# Patient Record
Sex: Female | Born: 1955 | Race: White | Hispanic: No | Marital: Married | Smoking: Former smoker
Health system: Southern US, Community
[De-identification: ages and names within clinical notes are randomized; demographics above are authoritative.]

## PROBLEM LIST (undated history)

## (undated) DIAGNOSIS — M1990 Unspecified osteoarthritis, unspecified site: Secondary | ICD-10-CM

## (undated) DIAGNOSIS — C4492 Squamous cell carcinoma of skin, unspecified: Secondary | ICD-10-CM

## (undated) DIAGNOSIS — M810 Age-related osteoporosis without current pathological fracture: Secondary | ICD-10-CM

## (undated) DIAGNOSIS — J302 Other seasonal allergic rhinitis: Secondary | ICD-10-CM

## (undated) DIAGNOSIS — C4491 Basal cell carcinoma of skin, unspecified: Secondary | ICD-10-CM

## (undated) DIAGNOSIS — E041 Nontoxic single thyroid nodule: Secondary | ICD-10-CM

## (undated) DIAGNOSIS — G43009 Migraine without aura, not intractable, without status migrainosus: Secondary | ICD-10-CM

## (undated) DIAGNOSIS — F32A Depression, unspecified: Secondary | ICD-10-CM

## (undated) DIAGNOSIS — F419 Anxiety disorder, unspecified: Secondary | ICD-10-CM

## (undated) DIAGNOSIS — K219 Gastro-esophageal reflux disease without esophagitis: Secondary | ICD-10-CM

## (undated) DIAGNOSIS — K5790 Diverticulosis of intestine, part unspecified, without perforation or abscess without bleeding: Secondary | ICD-10-CM

## (undated) DIAGNOSIS — Z8619 Personal history of other infectious and parasitic diseases: Secondary | ICD-10-CM

## (undated) HISTORY — PX: TONSILLECTOMY: SUR1361

## (undated) HISTORY — PX: NASAL SINUS SURGERY: SHX719

## (undated) HISTORY — DX: Diverticulosis of intestine, part unspecified, without perforation or abscess without bleeding: K57.90

## (undated) HISTORY — PX: COLONOSCOPY: SHX174

## (undated) HISTORY — DX: Gastro-esophageal reflux disease without esophagitis: K21.9

## (undated) HISTORY — DX: Nontoxic single thyroid nodule: E04.1

## (undated) HISTORY — DX: Anxiety disorder, unspecified: F41.9

## (undated) HISTORY — PX: SPINE SURGERY: SHX786

## (undated) HISTORY — PX: UPPER GASTROINTESTINAL ENDOSCOPY: SHX188

## (undated) HISTORY — PX: ABDOMINAL HYSTERECTOMY: SHX81

## (undated) HISTORY — DX: Age-related osteoporosis without current pathological fracture: M81.0

## (undated) HISTORY — DX: Other seasonal allergic rhinitis: J30.2

## (undated) HISTORY — PX: COSMETIC SURGERY: SHX468

## (undated) HISTORY — PX: VAGINAL HYSTERECTOMY: SUR661

## (undated) HISTORY — PX: OTHER SURGICAL HISTORY: SHX169

## (undated) HISTORY — DX: Depression, unspecified: F32.A

## (undated) HISTORY — DX: Migraine without aura, not intractable, without status migrainosus: G43.009

## (undated) HISTORY — DX: Unspecified osteoarthritis, unspecified site: M19.90

## (undated) HISTORY — DX: Personal history of other infectious and parasitic diseases: Z86.19

---
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
## 1979-08-26 HISTORY — PX: OTHER SURGICAL HISTORY: SHX169

## 1993-11-22 DIAGNOSIS — C4491 Basal cell carcinoma of skin, unspecified: Secondary | ICD-10-CM

## 1993-11-22 HISTORY — DX: Basal cell carcinoma of skin, unspecified: C44.91

## 1999-12-20 ENCOUNTER — Other Ambulatory Visit: Admission: RE | Admit: 1999-12-20 | Discharge: 1999-12-20 | Payer: Self-pay | Admitting: *Deleted

## 2000-06-29 ENCOUNTER — Other Ambulatory Visit: Admission: RE | Admit: 2000-06-29 | Discharge: 2000-06-29 | Payer: Self-pay | Admitting: *Deleted

## 2000-07-14 ENCOUNTER — Encounter: Payer: Self-pay | Admitting: *Deleted

## 2000-07-14 ENCOUNTER — Ambulatory Visit (HOSPITAL_COMMUNITY): Admission: RE | Admit: 2000-07-14 | Discharge: 2000-07-14 | Payer: Self-pay | Admitting: *Deleted

## 2001-09-23 ENCOUNTER — Other Ambulatory Visit: Admission: RE | Admit: 2001-09-23 | Discharge: 2001-09-23 | Payer: Self-pay | Admitting: *Deleted

## 2002-01-12 ENCOUNTER — Encounter (INDEPENDENT_AMBULATORY_CARE_PROVIDER_SITE_OTHER): Payer: Self-pay | Admitting: Specialist

## 2002-01-12 ENCOUNTER — Observation Stay (HOSPITAL_COMMUNITY): Admission: RE | Admit: 2002-01-12 | Discharge: 2002-01-13 | Payer: Self-pay | Admitting: *Deleted

## 2002-03-16 ENCOUNTER — Encounter: Payer: Self-pay | Admitting: Gastroenterology

## 2002-05-11 DIAGNOSIS — C4492 Squamous cell carcinoma of skin, unspecified: Secondary | ICD-10-CM

## 2002-05-11 HISTORY — DX: Squamous cell carcinoma of skin, unspecified: C44.92

## 2002-06-05 ENCOUNTER — Ambulatory Visit (HOSPITAL_COMMUNITY): Admission: RE | Admit: 2002-06-05 | Discharge: 2002-06-05 | Payer: Self-pay | Admitting: Gastroenterology

## 2002-06-05 ENCOUNTER — Encounter: Payer: Self-pay | Admitting: Gastroenterology

## 2002-06-22 ENCOUNTER — Encounter: Payer: Self-pay | Admitting: Gastroenterology

## 2002-09-21 ENCOUNTER — Other Ambulatory Visit: Admission: RE | Admit: 2002-09-21 | Discharge: 2002-09-21 | Payer: Self-pay | Admitting: *Deleted

## 2002-11-18 ENCOUNTER — Ambulatory Visit (HOSPITAL_COMMUNITY): Admission: RE | Admit: 2002-11-18 | Discharge: 2002-11-18 | Payer: Self-pay | Admitting: Pulmonary Disease

## 2003-11-09 ENCOUNTER — Other Ambulatory Visit: Admission: RE | Admit: 2003-11-09 | Discharge: 2003-11-09 | Payer: Self-pay | Admitting: Dermatology

## 2003-11-23 ENCOUNTER — Other Ambulatory Visit: Admission: RE | Admit: 2003-11-23 | Discharge: 2003-11-23 | Payer: Self-pay | Admitting: *Deleted

## 2003-12-08 ENCOUNTER — Inpatient Hospital Stay (HOSPITAL_COMMUNITY): Admission: EM | Admit: 2003-12-08 | Discharge: 2003-12-10 | Payer: Self-pay | Admitting: Emergency Medicine

## 2003-12-15 ENCOUNTER — Encounter
Admission: RE | Admit: 2003-12-15 | Discharge: 2003-12-15 | Payer: Self-pay | Admitting: Thoracic Surgery (Cardiothoracic Vascular Surgery)

## 2004-01-31 ENCOUNTER — Encounter: Admission: RE | Admit: 2004-01-31 | Discharge: 2004-01-31 | Payer: Self-pay | Admitting: Thoracic Surgery

## 2004-09-10 ENCOUNTER — Ambulatory Visit: Payer: Self-pay | Admitting: Gastroenterology

## 2004-10-08 ENCOUNTER — Ambulatory Visit: Payer: Self-pay | Admitting: Gastroenterology

## 2004-11-05 ENCOUNTER — Ambulatory Visit: Payer: Self-pay | Admitting: Gastroenterology

## 2004-11-09 ENCOUNTER — Ambulatory Visit (HOSPITAL_COMMUNITY): Admission: RE | Admit: 2004-11-09 | Discharge: 2004-11-09 | Payer: Self-pay | Admitting: Gastroenterology

## 2004-11-28 ENCOUNTER — Other Ambulatory Visit: Admission: RE | Admit: 2004-11-28 | Discharge: 2004-11-28 | Payer: Self-pay | Admitting: *Deleted

## 2004-12-27 ENCOUNTER — Ambulatory Visit: Payer: Self-pay | Admitting: Pulmonary Disease

## 2005-01-28 ENCOUNTER — Ambulatory Visit: Admission: RE | Admit: 2005-01-28 | Discharge: 2005-01-28 | Payer: Self-pay | Admitting: Pulmonary Disease

## 2005-01-28 ENCOUNTER — Ambulatory Visit: Payer: Self-pay | Admitting: Pulmonary Disease

## 2005-06-25 ENCOUNTER — Emergency Department (HOSPITAL_COMMUNITY): Admission: EM | Admit: 2005-06-25 | Discharge: 2005-06-25 | Payer: Self-pay | Admitting: Emergency Medicine

## 2005-09-18 ENCOUNTER — Ambulatory Visit (HOSPITAL_COMMUNITY): Admission: RE | Admit: 2005-09-18 | Discharge: 2005-09-18 | Payer: Self-pay | Admitting: Family Medicine

## 2005-10-23 ENCOUNTER — Other Ambulatory Visit: Admission: RE | Admit: 2005-10-23 | Discharge: 2005-10-23 | Payer: Self-pay | Admitting: *Deleted

## 2006-05-10 ENCOUNTER — Emergency Department (HOSPITAL_COMMUNITY): Admission: EM | Admit: 2006-05-10 | Discharge: 2006-05-10 | Payer: Self-pay | Admitting: Emergency Medicine

## 2006-12-24 HISTORY — PX: BACK SURGERY: SHX140

## 2007-02-10 ENCOUNTER — Other Ambulatory Visit: Admission: RE | Admit: 2007-02-10 | Discharge: 2007-02-10 | Payer: Self-pay | Admitting: *Deleted

## 2007-02-23 ENCOUNTER — Inpatient Hospital Stay (HOSPITAL_COMMUNITY): Admission: RE | Admit: 2007-02-23 | Discharge: 2007-02-26 | Payer: Self-pay | Admitting: Orthopedic Surgery

## 2007-02-23 ENCOUNTER — Encounter (INDEPENDENT_AMBULATORY_CARE_PROVIDER_SITE_OTHER): Payer: Self-pay | Admitting: Orthopedic Surgery

## 2007-12-07 ENCOUNTER — Other Ambulatory Visit: Admission: RE | Admit: 2007-12-07 | Discharge: 2007-12-07 | Payer: Self-pay | Admitting: *Deleted

## 2008-08-29 IMAGING — CR DG CHEST 2V
2 series · 2 of 2 positions shown · non-contrast
Comparison: 01/31/2004

CLINICAL DATA: Lumbar disk herniation. Preop respiratory exam for diskectomy. 
 CHEST - 2 VIEW:

[view not recorded (1 of 2)]
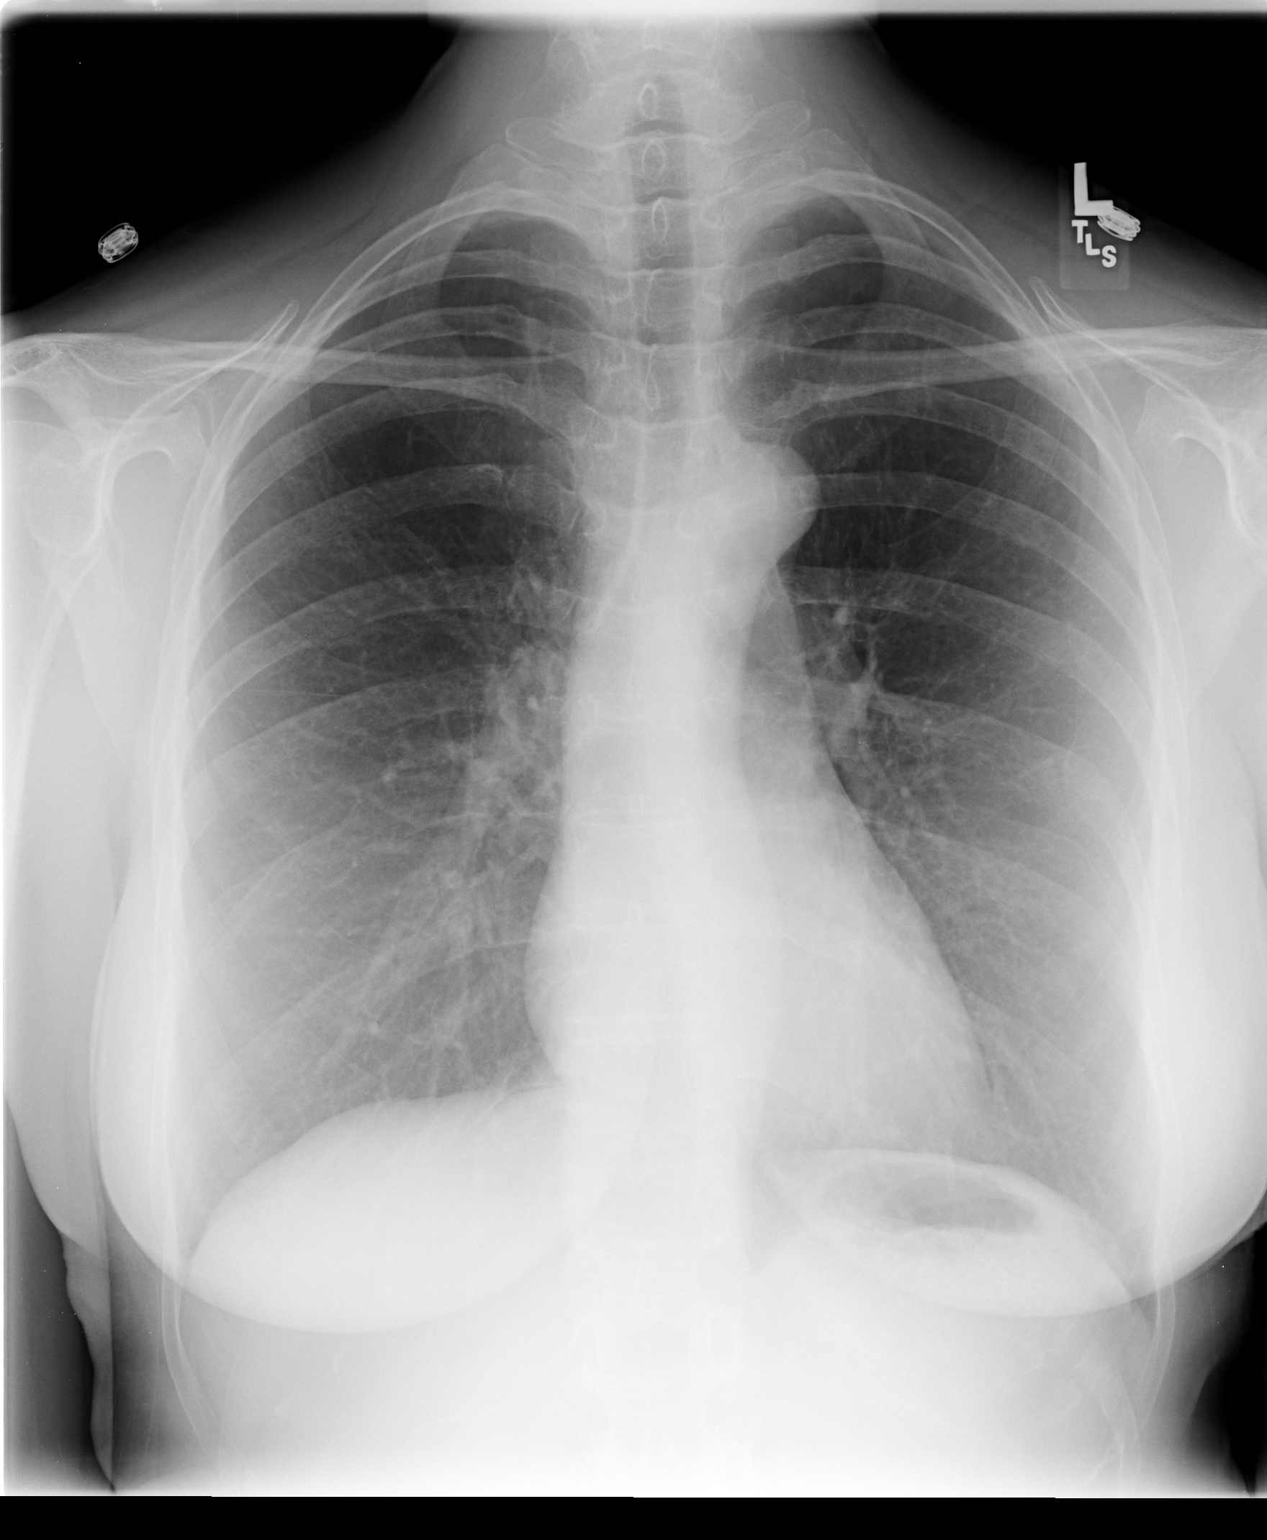

[view not recorded (2 of 2)]
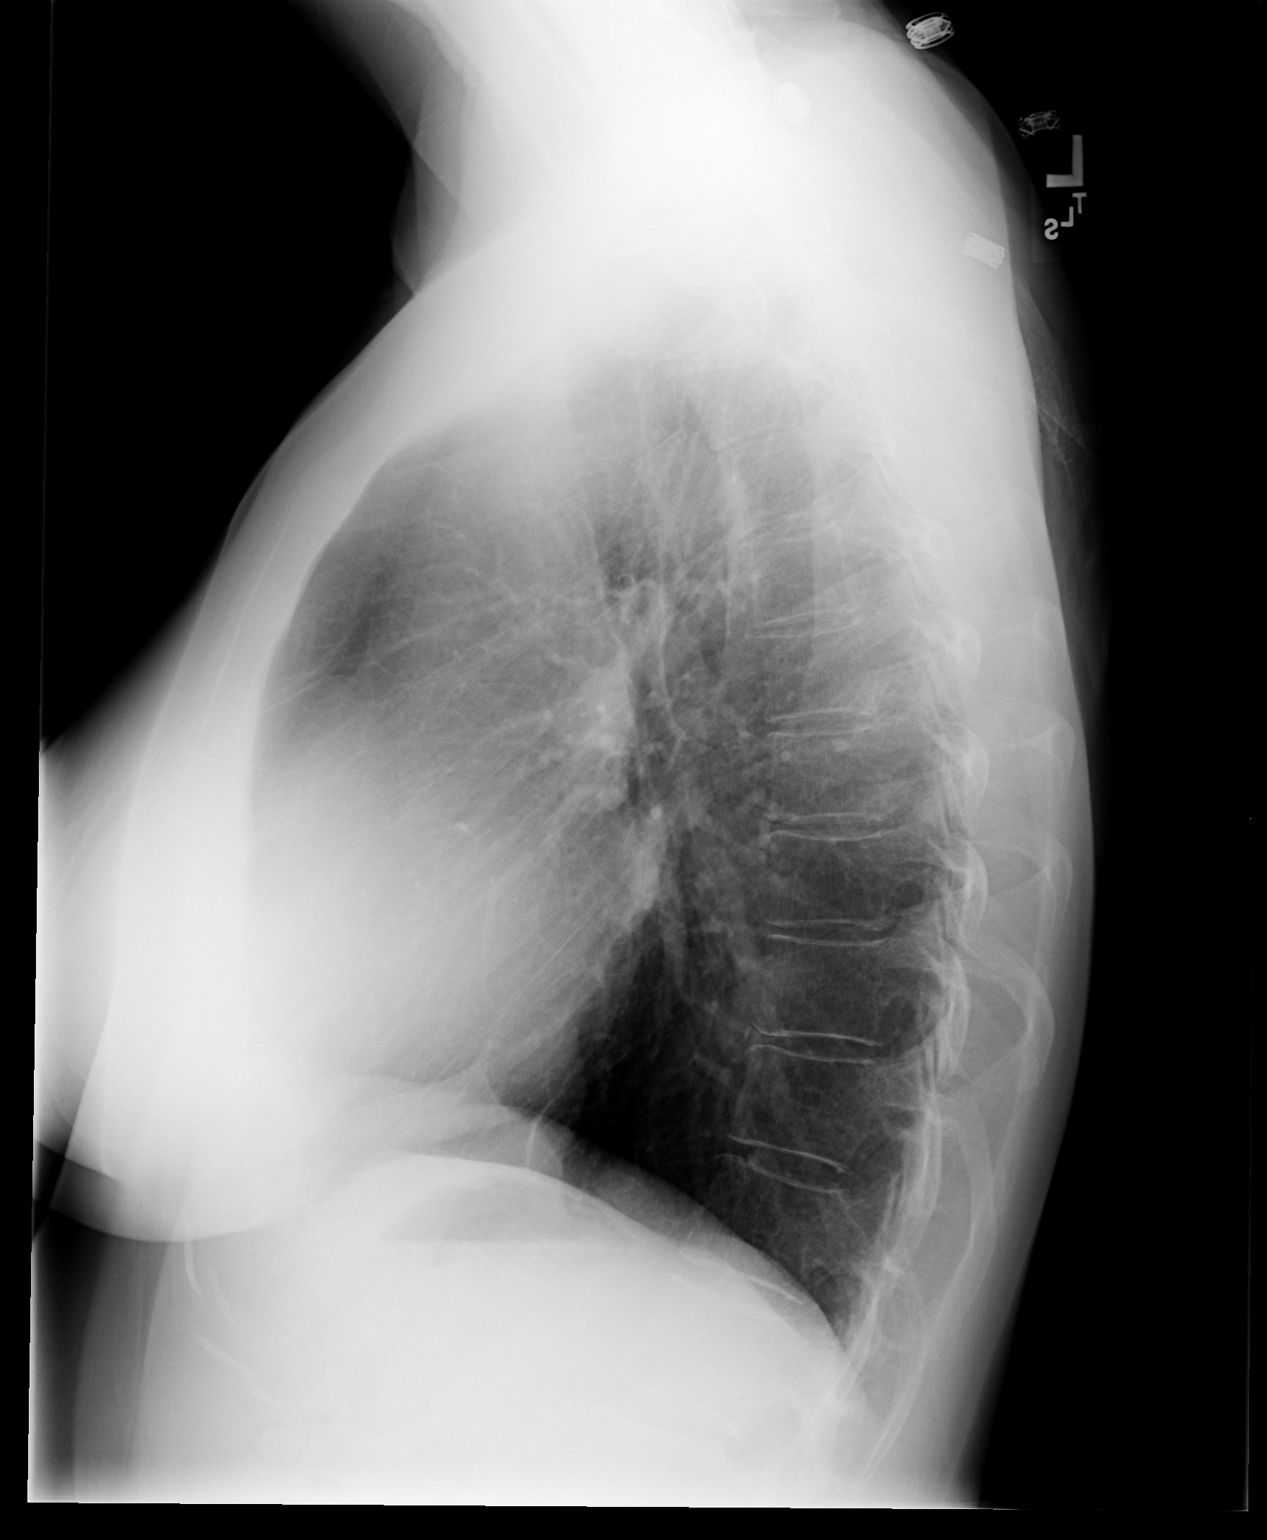

[2 of 2 positions shown; findings below may reference images not displayed]

FINDINGS: Heart size and mediastinal contours are normal. Both lungs are clear. There is no evidence of pleural effusion. No mass or adenopathy identified.
IMPRESSION: Stable chest. No active cardiopulmonary disease.

## 2009-07-04 ENCOUNTER — Telehealth: Payer: Self-pay | Admitting: Gastroenterology

## 2009-07-04 ENCOUNTER — Ambulatory Visit: Payer: Self-pay | Admitting: Gastroenterology

## 2009-07-04 ENCOUNTER — Encounter: Payer: Self-pay | Admitting: Physician Assistant

## 2009-07-04 DIAGNOSIS — K5732 Diverticulitis of large intestine without perforation or abscess without bleeding: Secondary | ICD-10-CM

## 2009-07-04 DIAGNOSIS — K219 Gastro-esophageal reflux disease without esophagitis: Secondary | ICD-10-CM | POA: Insufficient documentation

## 2009-07-04 DIAGNOSIS — K5730 Diverticulosis of large intestine without perforation or abscess without bleeding: Secondary | ICD-10-CM | POA: Insufficient documentation

## 2009-07-04 DIAGNOSIS — R1032 Left lower quadrant pain: Secondary | ICD-10-CM | POA: Insufficient documentation

## 2009-07-04 HISTORY — DX: Diverticulitis of large intestine without perforation or abscess without bleeding: K57.32

## 2009-07-05 ENCOUNTER — Telehealth: Payer: Self-pay | Admitting: Physician Assistant

## 2009-07-09 ENCOUNTER — Telehealth: Payer: Self-pay | Admitting: Physician Assistant

## 2009-07-09 LAB — CONVERTED CEMR LAB
Basophils Absolute: 0.1 10*3/uL (ref 0.0–0.1)
Basophils Relative: 0.9 % (ref 0.0–3.0)
Eosinophils Relative: 1.7 % (ref 0.0–5.0)
Lymphs Abs: 2.4 10*3/uL (ref 0.7–4.0)
MCHC: 34.2 g/dL (ref 30.0–36.0)
MCV: 93 fL (ref 78.0–100.0)
Neutro Abs: 4.9 10*3/uL (ref 1.4–7.7)
Neutrophils Relative %: 58.4 % (ref 43.0–77.0)
WBC: 8.3 10*3/uL (ref 4.5–10.5)

## 2009-07-11 ENCOUNTER — Telehealth: Payer: Self-pay | Admitting: Physician Assistant

## 2009-07-17 ENCOUNTER — Telehealth: Payer: Self-pay | Admitting: Gastroenterology

## 2009-10-29 ENCOUNTER — Ambulatory Visit (HOSPITAL_COMMUNITY): Admission: RE | Admit: 2009-10-29 | Discharge: 2009-10-29 | Payer: Self-pay | Admitting: Family Medicine

## 2009-12-25 ENCOUNTER — Encounter: Admission: RE | Admit: 2009-12-25 | Discharge: 2009-12-25 | Payer: Self-pay | Admitting: Endocrinology

## 2009-12-25 ENCOUNTER — Other Ambulatory Visit: Admission: RE | Admit: 2009-12-25 | Discharge: 2009-12-25 | Payer: Self-pay | Admitting: Interventional Radiology

## 2010-05-27 ENCOUNTER — Ambulatory Visit (HOSPITAL_COMMUNITY): Admission: RE | Admit: 2010-05-27 | Discharge: 2010-05-27 | Payer: Self-pay | Admitting: Endocrinology

## 2011-01-07 NOTE — Discharge Summary (Signed)
NAMENEHA, [name]                ACCOUNT NO.:  [id number]   MEDICAL RECORD NO.:  [id number]          PATIENT TYPE:  INP   LOCATION:  2011                         FACILITY:  [facility]   PHYSICIAN:  Nelda Severe, MD      DATE OF BIRTH:  Feb 19, 1956   DATE OF ADMISSION:  02/23/2007  DATE OF DISCHARGE:  02/26/2007                               DISCHARGE SUMMARY   PREOPERATIVE DIAGNOSIS:  Left L5-S1 disk herniation.   POSTOPERATIVE DIAGNOSIS:  Left L5-S1 laminotomy with disk excision.   Postoperatively, the patient was stable.  Minimal blood loss, 75 mL.  Active range of motion.  Sensation was intact bilateral lower  extremities.  She states that her left leg felt better, decreased pain.  She was admitted to [facility].  She was admitted to 5000 floor  orthopedically.  Later that evening about two hours postsurgery, she was  complaining of chest pain on the left side.  A new EKG was done, and  showed nonspecific T-wave abnormalities, which was not reported on  preoperative EKG.  Preoperative said sinus bradycardia, low voltage QRS  waves.  I called in a cardiac consult to Dr. Sharyn Lull, who ordered CK-MB,  troponin-I x3 q.8h.  He ordered an EKG daily x2 days, transferred her to  a telemetry bed and started her on 81 mg baby aspirin.  The workup was  benign, basically; no cardiac changes.  She was going to follow up with  Dr. Sharyn Lull in two weeks after discharge from today's date, which is  February 26, 2007.  Active range of motion..  Bilateral lower extremities  intact, sensation grossly intact.  Troponin and CK-MB levels were all  normal.  She does have elevated cholesterol at 229.  There was a  nutritional consult ordered by cardiac on February 25, 2007, providing  guidelines for diet to decrease cholesterol.  Her incision site has  serosanguineous drainage postop day two.  We are going to keep her on  flat bedrest, up for bathroom only, hold on physical therapy.  This  morning, on February 26, 2007,  absolutely no drainage on the dressing, clean,  dry and intact.  The calves are soft, nontender to palpation, active  range of motion and sensation, bilateral lower extremities intact.  Palpable pulses.  Independently moving about the room.  Feeling more  alert, oriented.  No complaints of headache.   DISCHARGE DIAGNOSIS:  Status post posterior lumbar disk excision L5-S1,  left.   DISPOSITION:  Stable.   DIET:  Regular.   She may shower.  Replace a clean, dry dressing until no drainage  whatsoever on the dressing.  Ambulation for activity.  We sent her home  with Norco 10/325 one to two q.4h. p.r.n. for pain control.  Also given  Robaxin 500 mg  one every six hours p.r.n. for muscle spasms.  She is going to continue  on her regularly scheduled medicines, to include Paxil 10 mg once daily  p.r.n., Wellbutrin 75 mg once p.o. p.r.n. and Fosamax weekly.  We are  going to see her back in our office in early  August.  She is going to  follow up with cardiologist in two weeks.      [name], P.A.      Nelda Severe, MD  Electronically Signed    MC/MEDQ  D:  02/26/2007  T:  02/26/2007  Job:  [id number]

## 2011-01-07 NOTE — H&P (Signed)
[name], [name]                ACCOUNT NO.:  [id number]   MEDICAL RECORD NO.:  [id number]          PATIENT TYPE:  INP   LOCATION:  NA                           FACILITY:  [facility]   PHYSICIAN:  Nelda Severe, MD      DATE OF BIRTH:  08-16-1956   DATE OF ADMISSION:  DATE OF DISCHARGE:                              HISTORY & PHYSICAL   CHIEF COMPLAINT:  Back pain, left leg pain secondary to L5-S1 herniated  disc.   OTHER MEDICAL HISTORY:  Includes depression and anxiety.   ALLERGIES:  She has no known drug allergies.  She does have a dislike  for morphine.   MEDICATIONS:  Today include Paxil, Wellbutrin, and Darvocet.   PAST MEDICAL HISTORY:  Also includes hepatitis, [facility] Spotted  Fever, collapsed lung, and colitis.   PAST SURGICAL HISTORY:  Includes a hysterectomy.   FAMILY HISTORY:  She has a father with coronary artery disease.  Mother  with hypertension.  Mother with diabetes.  Mom and father have  arthritis.  An aunt, a grandmother, and an uncle have cancer.   REVIEW OF SYSTEMS:  She reports she does wear reading glasses.  No  changes in vision or hearing.  No chronic cough.  No bowel or bladder  incontinence.  No nausea, vomiting, or diarrhea.  She does have frequent  constipation secondary to medications.  She does have hot and cold  flashes secondary to post menopausal hysterectomy.   On physical exam today, her temperature is 97.8, pulse 74, respirations  20, blood pressure 125/87.  In general, she is well-developed, well-  nourished, normocephalic.  HEENT:  Pupils are equal, round, reactive to light.  Full range of  motion of the neck.  No adenopathy.  CHEST:  Clear to auscultation, no wheezing.  HEART:  Regular rate and rhythm, no murmurs.  ABDOMEN:  Soft, nontender to palpation.  Positive bowel sounds.  EXTREMITIES:  Left leg pain that radiates to the foot posterior.  SKIN:  Clean, dry, and intact.   MRI was reviewed and shows L5-S1 herniated  disc.   PLAN:  Discectomy at L5-S1 by Dr. Nelda Severe.      [name], P.A.      Nelda Severe, MD  Electronically Signed    MC/MEDQ  D:  02/19/2007  T:  02/20/2007  Job:  [id number]

## 2011-01-07 NOTE — Op Note (Signed)
[name], [name]                ACCOUNT NO.:  [id number]   MEDICAL RECORD NO.:  [id number]          PATIENT TYPE:  INP   LOCATION:  NA                           FACILITY:  [facility]   PHYSICIAN:  Nelda Severe, MD      DATE OF BIRTH:  August 04, 1956   DATE OF PROCEDURE:  02/23/2007  DATE OF DISCHARGE:                               OPERATIVE REPORT   SURGEON:  Nelda Severe, M.D.   ASSISTANT:  [name], PA-C.   PREOPERATIVE DIAGNOSIS:  Left L5-S1 disc herniation.   POSTOPERATIVE DIAGNOSIS:  Left L5-S1 disc herniation.   OPERATIVE PROCEDURE:  Left L5-S1 laminotomy and disc excision.   OPERATIVE FINDINGS:  Extruded fragment of disc distal and lateral to the  S1 nerve root on the left side.  The left S1 nerve root was extremely  large.   OPERATIVE NOTE:  The patient was placed under general endotracheal  anesthesia.  The patient was not catheterized.  Intravenous antibiotics  had been infused for prophylaxis.  Bilateral sequential compression  devices were placed on both lower extremities.  The patient was  positioned prone on the [place] frame.  Care was taken to position the  upper extremities so as to avoid hyperflexion and abduction of the  shoulders and hyperflexion of the elbows.  Foam was used to pad the  upper extremities from axilla to hands.  The thighs, knees, shins, and  ankles were supported on pillows.   The lumbar area was prepped with DuraPrep and draped in rectangular  fashion.  Drapes were secured with Ioban.  A midline incision was made  into the dermis at what was perceived to be the L5-S1 level.  The  subcutaneous layer was injected with a mixture of 0.25% Marcaine and 1%  lidocaine with epinephrine.  Dissection was carried down onto the  spinous processes.  A cross-table lateral radiograph was taken with a  Kocher on the S1 spinous process, which was consistent with our  impression of last mobile segment.  A later x-ray was taken with a  Penfield #4 in  the L5-S1 disc space to document that we were at the L5-  S1 level.   The lumbar fascia was incised along the midline, and the paraspinal  muscles reflected to the left side at L5-S1.  The L5-S1 interlaminar  space was clear of soft tissue, and a curved curette used to separate  ligamentum flavum from the undersurface of the lamina, as well as the  from the medial edge of the facette joint.  I then used a high-speed bur  to perform about a 25% medial facetectomy at L5-S1 to thin out the  lamina proximally for about 5-10 mm.  The laminotomy was then finished  with a Kerrison rongeur.  I then removed some of the superior lamina  from S1 and medial edge of the superior reticular process of S1,  releasing the ligamentum flavum and facette joint capsule.  We then  removed some more of the ligamentum flavum with Kerrison rongeurs.  The  S1 nerve root was very large was retracted medially.  I  was able to hook  out a small extruded fragment of disc.  Later, it became apparent that  there was a much larger piece lateral to the nerve root, between the  nerve root and the medial side of the pedicle.   I then, with the origins of the S1 nerve root retracted medially,  incised the annulus posterolaterally.  Epstein curettes were then used  to loosen up fragments, and a pituitary rongeur used to remove multiple  small fragments.  Further probing of the disc space with a ball-tip  nerve hook developed the plane through which the disc fragment had  extruded down to the bleeding bone on the back of the vertebral body.  Ultimately, after more curettage, more searching and finding of  fragments, I felt that the nerve root had been adequately decompressed.  Ligamentum flavum was removed a little more proximally to ensure the  lateral edge of the dura was decompressed.  I felt the L5-S1 neural  foramen was a little tight on palpation with a ball-tip probe, and  therefore a #3 mm Kerrison was used to perform  for a foraminotomy at L5-  S1 to make sure that the L5 nerve root, exiting above the L5-S1 days,  was well decompressed.   Epidural bleeders were closed with a combination of bipolar coagulation  and Gelfoam patties which were removed after leaving them in place for  about 5 or 6 minutes.  There was a very small amount of oozing in the  wound.  FloSeal was injected into the laminectomy defect.  Prior to  doing that, we had irrigated the disc space thoroughly with saline  solution and a few fragments removed which floated out in the effluent.   Next, a 15-gauge Blake drain was placed subfascially and brought out  through the skin to the left side.  The thoracolumbar fascia was closed  using interrupted #1 Vicryl suture.  The subcutaneous layer was closed  using interrupted 2-0 Vicryl suture.  The skin was closed using a  subcuticular continuous undyed Vicryl suture.  The skin edges were  reinforced with Steri-Strips.  The drain was secured with a 2-0 nylon  suture.  Nonadherent antibiotic ointment dressing was applied and  secured with OpSite.      Nelda Severe, MD  Electronically Signed     MT/MEDQ  D:  02/23/2007  T:  02/23/2007  Job:  [id number]

## 2011-01-10 NOTE — Op Note (Signed)
[facility]  Patient:    [name], [name] Visit Number: [id number] MRN: [id number]          Service Type: SUR Location: 4W 0452 01 Attending Physician:  Collene Schlichter Dictated by:   Almedia Balls. Randell Patient, M.D. Proc. Date: 01/12/02 Admit Date:  01/12/2002 Discharge Date: 01/13/2002   CC:         Harl Bowie, M.D.   Operative Report  PREOPERATIVE DIAGNOSES: 1. Abnormal uterine bleeding. 2. Uterine enlargement. 3. Pelvic pain. 4. History of endometriosis.  POSTOPERATIVE DIAGNOSIS: 1. Abnormal uterine bleeding. 2. Uterine enlargement. 3. Pelvic pain. 4. History of endometriosis.  PATHOLOGY:  Pending.  OPERATION:  Laparoscopy, vaginal hysterectomy.  ANESTHESIA:  General orotracheal.  SURGEON:  Almedia Balls. Randell Patient, M.D.  FIRST ASSISTANT:  Harl Bowie, M.D.  INDICATION FOR SURGERY:  The patient is a 55 year old with the above noted problems who is admitted for hysterectomy, possible bilateral salpingo-oophorectomy on May 21. She has been fully counseled as to the nature of the procedure and the risks involved including risks of anesthesia, injury to bowel, bladder, blood vessels, ureters, postoperative hemorrhage, infection, recuperation, and possible use of hormone replacement should her ovaries be removed. She fully understands all of these considerations and wishes to proceed on Jan 12, 2002.  OPERATIVE FINDINGS ON LAPAROSCOPY:  The lower liver edge, gallbladder, spleen, area of the appendix were normal to visualization. The uterus was mid posterior approximately [redacted] weeks gestational size. Both ovaries and tubes appeared normal at this point, with the corpus luteum present on the left ovary. There are no areas of presumed endometriosis as had been previously noted in the pelvic peritoneum.  PROCEDURE:  With the patient under general anesthesia, prepared and draped in the usual sterile fashion, with an empty bladder post  catheterization and a tenaculum on the cervix, an incision was made in the lower pole of the umbilicus. Veress cannula was inserted into the peritoneal cavity with insufflation of 3 L of carbon dioxide. The disposable 10 mm trocar for operative scope and the scope itself was then inserted into the peritoneal cavity. Disposable 5 mm probe was likewise placed in the lower abdomen with insertion of the probe. The above noted findings were visualized. Bipolar electrocoagulation was used to render the uterine ovarian anastomoses, tubes, and round ligaments hemostatic. These areas bilaterally were gradually rendered hemostatic and then transected for conservation of the ovaries and freeing of the uterus. Bladder flap was created on the anterior surface of the uterus using bipolar electrocoagulation and sharp dissection.  After noting that hemostasis was maintained in the peritoneal area, the patient was repositioned for a vaginal procedure. Awaited speculum was placed in the posterior vagina, and the cervix was grasped with two Christella Hartigan tenaculum. A solution of 1% lidocaine with 1:200,000 epinephrine was injected circumferentially on the cervix for hemostasis and delineation of fascial planes. An incision was made in the anterior vaginal mucosa for dissection of the bladder off the cervix and lower uterine segment. This was accomplished using sharp and blunt dissection. Because of the length of the cervix, it was not possible to enter the anterior cul-de-sac immediately. The posterior cul-de-sac posed the same problem in that quite a bit of dissection was necessary to enter the posterior cul-de-sac. Haney clamps were used to clamp the vaginal mucosa bilaterally to free it from the cervix. These areas were then cut and suture ligated with 1 chromic catgut. It was then possible to enter the pelvic posterior peritoneum. Haney clamps  were used bilaterally to clamp the uterosacral ligaments, cardinal  ligaments, and uterine vessels; these structures were successfully cut free and individually ligated with 1 chromic catgut. It was then possible to invert the uterus and to excise the uterus by clamping and cutting and tying the remaining small portions around the broad ligament that was left. The area was then lavaged with copious amounts of lactated Ringer solution. A modified McCall suture was placed in the posterior cul-de-sac for prevention of enterocele. The peritoneum was then closed with a pursestring suture of 1 chromic catgut. Vaginal cuff was reapproximated and rendered hemostatic with continuous interlocking suture of 1 chromic catgut. The patient was then catheterized with free flow of clear urine and positioned and draped for laparoscopy procedure.  After changing gloves, the laparoscope was inserted into the peritoneal cavity which was then re-insufflated with three liters of carbon dioxide. The operative site was inspected and lavaged with copious amounts of lactated Ringer solution. After noting it all was hemostatic, following reduction of intra-abdominal pressure by allowing gas to escape and then sponge and instrument counts were correct, the instruments were removed from the peritoneal cavity. The gas was allowed to fully escape, and the incisions were closed with fascial sutures of 0 Vicryl and subcuticular sutures of 3-0 p plain catgut. Estimated blood loss:  100 mL. The patient was taken to the recovery room in good condition. Following catheterization, free flow of clear urine. She will be placed on 23 hour observation following surgery. Dictated by:   Almedia Balls Randell Patient, M.D. Attending Physician:  Collene Schlichter DD:  01/12/02 TD:  01/14/02 Job: [id number]

## 2011-01-10 NOTE — Discharge Summary (Signed)
[facility]  Patient:    [name], CO Visit Number: [id number] MRN: [id number]          Service Type: SUR Location: 4W 0452 01 Attending Physician:  Collene Schlichter Dictated by:   Almedia Balls. Randell Patient, M.D. Admit Date:  01/12/2002 Discharge Date: 01/13/2002                             Discharge Summary  HISTORY OF PRESENT ILLNESS:  The patient is a 55 year old with abnormal uterine bleeding, pelvic pain, uterine enlargement, history of endometriosis for hysterectomy with possible bilateral salpingo-oophorectomy on May 21.  The remainder of her History and Physical are as previously dictated.  LABORATORY DATA AND X-RAY FINDINGS:  Preoperative hemoglobin of 12.6.  HOSPITAL COURSE:  The patient was taken to the operating room on May 21, at which time laparoscopy with assist for a vaginal hysterectomy was performed. This was accomplished without difficulty.  Diet and ambulation were progressed over the evening of May 21, and the early morning of May 22.  On the morning of May 22, she was afebrile and experiencing no problems voiding without any difficulty.  It was felt she could be discharged at this time.  DISCHARGE DIAGNOSES: 1. Abnormal uterine bleeding. 2. Pelvic pain. 3. Dysmenorrhea. 4. Uterine enlargement. 5. History of endometriosis.  PROCEDURE:  Laparoscopically assisted vaginal hysterectomy.  Pathology report unavailable at the time of dictation.  DISPOSITION:  Discharged to home.  FOLLOWUP:  Return to the office in two weeks for followup.  ACTIVITY:  Full ambulatory.  DIET:  Regular.  CONDITION ON DISCHARGE:  Good.  DISCHARGE MEDICATIONS: 1. Darvocet-N 100 or generic, #30, 1/2 to 2 q.4h. p.r.n. pain. 2. Doxycycline 100 mg, #12, to be taken one b.i.d. Dictated by:   Almedia Balls Randell Patient, M.D. Attending Physician:  Collene Schlichter DD:  01/13/02 TD:  01/17/02 Job: [id number]

## 2011-01-10 NOTE — H&P (Signed)
[facility]  Patient:    [name], [name] Visit Number: [id number] MRN: [id number]          Service Type: Attending:  Almedia Balls. Randell Patient, M.D. Dictated by:   Almedia Balls Randell Patient, M.D. Adm. Date:  01/12/02                           History and Physical  CHIEF COMPLAINT:  Fibroids, abnormal bleeding, and possible adenomyosis.  HISTORY OF PRESENT ILLNESS:  The patient is a 55 year old, gravida 1, para 1, whose last menstrual period was Dec 26, 2001.  She has been followed in our office over several years.  She has had increasingly severe problems with her menses in that they were much heavier than previously to the point where she "floods" and bleeds through clothing onto bedsheets.  She has had hemoglobins in the 11 g range.  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  She is admitted at this time for hysterectomy and possible bilateral salpingo-oophorectomy. She has been fully counseled as to the nature of the procedure and the risks involved to include the risks of anesthesia, injury to bowel, bladder, blood vessels, ureters, postoperative hemorrhage, infection, recuperation, and use of hormone replacement should her ovaries be removed.  She fully understands all of these considerations and wishes to proceed on Jan 12, 2002.  PAST SURGICAL HISTORY:  Includes the above-noted surgery.  She also underwent hysteroscopy, D&C, and laparoscopy in 2001 with findings of endometriosis.  ALLERGIES:  She is allergic to no medications.  MEDICATIONS:  She has taken various progestational agents and analgesics recently in an attempt to improve her bleeding pattern.  FAMILY HISTORY:  Her father died with sudden MI at age 75.  Otherwise no significant family history.  The patient herself is a nonsmoker.  REVIEW OF SYSTEMS:   HEENT:  Negative.  Cardiorespiratory:  Negative. Gastrointestinal:  Negative.  Genitourinary:  As in present illness. Neuromuscular:  Negative.  PHYSICAL EXAMINATION:  Height 5 feet 8-1/4 inches.  Weight 142 pounds.  VITAL SIGNS:  Blood pressure 98/60, pulse 84, respirations 18.  GENERAL APPEARANCE:  A well-developed white female in no acute distress.  HEENT:  Within normal limits.  NECK:  Supple without masses, adenopathy, or bruits.  HEART:  Regular rate and rhythm without murmurs.  LUNGS:  Clear to P&A.  BREASTS:  Examined sitting and lying without mass.  Axillae negative. Bilateral subpectoral implants.  ABDOMEN:  Soft without mass and nontender.  PELVIC:  External genitalia and Bartholins, urethral, and Skenes glands within normal limits.  The cervix is slightly inflamed.  The uterus is mid posterior, quite irregular, and enlarged to approximately [redacted] weeks gestational size.  There were no palpable adnexal masses.  EXTREMITIES:  Within normal limits.  CENTRAL NERVOUS SYSTEM:  Grossly intact.  SKIN:  Without suspicious lesion.  IMPRESSION: 1. Abnormal uterine bleeding. 2. Anemia secondary to abnormal uterine bleeding. 3. Uterine enlargement.  DISPOSITION:  Admit for above-noted surgery.  Of note is that a Pap smear was normal in January of 2003. Dictated by:   Almedia Balls Randell Patient, M.D. Attending:  Almedia Balls. Randell Patient, M.D. DD:  01/04/02 TD:  01/05/02 Job: [id number]

## 2011-06-10 LAB — BASIC METABOLIC PANEL
CO2: 28
CO2: 28
Calcium: 8.1 — ABNORMAL LOW
Chloride: 101
GFR calc Af Amer: >60
GFR calc non Af Amer: >60
Glucose, Bld: 147 — ABNORMAL HIGH
Sodium: 141

## 2011-06-10 LAB — LIPID PANEL
Cholesterol: 229 — ABNORMAL HIGH
HDL: 58
LDL Cholesterol: 158 — ABNORMAL HIGH
VLDL: 13

## 2011-06-10 LAB — CBC
MCHC: 33.8
Platelets: 267
WBC: 13.2 — ABNORMAL HIGH

## 2011-06-10 LAB — CK TOTAL AND CKMB (NOT AT ARMC): Relative Index: INVALID

## 2011-06-10 LAB — CARDIAC PANEL(CRET KIN+CKTOT+MB+TROPI)
CK, MB: 3.4
Relative Index: INVALID
Total CK: 104

## 2011-06-11 LAB — URINE CULTURE

## 2011-06-11 LAB — DIFFERENTIAL
Basophils Absolute: 0
Eosinophils Absolute: 0.1
Eosinophils Relative: 2

## 2011-06-11 LAB — URINALYSIS, ROUTINE W REFLEX MICROSCOPIC
Glucose, UA: NEGATIVE
Hgb urine dipstick: NEGATIVE
Ketones, ur: NEGATIVE
Protein, ur: NEGATIVE

## 2011-06-11 LAB — PROTIME-INR: Prothrombin Time: 13.1

## 2011-06-11 LAB — COMPREHENSIVE METABOLIC PANEL
ALT: 13
AST: 19
CO2: 30
Chloride: 105
GFR calc Af Amer: >60
GFR calc non Af Amer: 59 — ABNORMAL LOW
Sodium: 140
Total Bilirubin: 0.5

## 2011-06-11 LAB — ABO/RH: ABO/RH(D): A POS

## 2011-06-11 LAB — CBC
MCV: 91.5
RBC: 4.12
WBC: 6.1

## 2011-06-11 LAB — TYPE AND SCREEN: Antibody Screen: NEGATIVE

## 2012-03-12 ENCOUNTER — Encounter: Payer: Self-pay | Admitting: Gastroenterology

## 2012-03-22 ENCOUNTER — Encounter: Payer: Self-pay | Admitting: Gastroenterology

## 2012-04-27 ENCOUNTER — Encounter: Payer: Self-pay | Admitting: Gastroenterology

## 2012-04-27 ENCOUNTER — Ambulatory Visit (AMBULATORY_SURGERY_CENTER): Payer: BC Managed Care – PPO | Admitting: *Deleted

## 2012-04-27 VITALS — Ht 68.0 in | Wt 156.3 lb

## 2012-04-27 DIAGNOSIS — Z1211 Encounter for screening for malignant neoplasm of colon: Secondary | ICD-10-CM

## 2012-04-27 MED ORDER — MOVIPREP 100 G PO SOLR: 1.0000 | Freq: Once | ORAL | Status: DC

## 2012-04-30 ENCOUNTER — Encounter: Payer: Self-pay | Admitting: Gastroenterology

## 2012-04-30 ENCOUNTER — Ambulatory Visit (AMBULATORY_SURGERY_CENTER): Payer: BC Managed Care – PPO | Admitting: Gastroenterology

## 2012-04-30 VITALS — BP 131/81 | HR 52 | Temp 97.5°F | Resp 16 | Ht 68.0 in | Wt 156.0 lb

## 2012-04-30 DIAGNOSIS — Z1211 Encounter for screening for malignant neoplasm of colon: Secondary | ICD-10-CM

## 2012-04-30 DIAGNOSIS — K5730 Diverticulosis of large intestine without perforation or abscess without bleeding: Secondary | ICD-10-CM

## 2012-04-30 DIAGNOSIS — D126 Benign neoplasm of colon, unspecified: Secondary | ICD-10-CM

## 2012-04-30 MED ORDER — SODIUM CHLORIDE 0.9 % IV SOLN: 500.0000 mL | INTRAVENOUS | Status: DC

## 2012-04-30 NOTE — Progress Notes (Signed)
Patient did not experience any of the following events: a burn prior to discharge; a fall within the facility; wrong site/side/patient/procedure/implant event; or a hospital transfer or hospital admission upon discharge from the facility. (G8907) Patient did not have preoperative order for IV antibiotic SSI prophylaxis. (G8918)  

## 2012-04-30 NOTE — Patient Instructions (Addendum)
YOU HAD AN ENDOSCOPIC PROCEDURE TODAY AT THE [facility]: Refer to the procedure report that was given to you for any specific questions about what was found during the examination.  If the procedure report does not answer your questions, please call your gastroenterologist to clarify.  If you requested that your care partner not be given the details of your procedure findings, then the procedure report has been included in a sealed envelope for you to review at your convenience later.  YOU SHOULD EXPECT: Some feelings of bloating in the abdomen. Passage of more gas than usual.  Walking can help get rid of the air that was put into your GI tract during the procedure and reduce the bloating. If you had a lower endoscopy (such as a colonoscopy or flexible sigmoidoscopy) you may notice spotting of blood in your stool or on the toilet paper. If you underwent a bowel prep for your procedure, then you may not have a normal bowel movement for a few days.  DIET: Your first meal following the procedure should be a light meal and then it is ok to progress to your normal diet.  A half-sandwich or bowl of soup is an example of a good first meal.  Heavy or fried foods are harder to digest and may make you feel nauseous or bloated.  Likewise meals heavy in dairy and vegetables can cause extra gas to form and this can also increase the bloating.  Drink plenty of fluids but you should avoid alcoholic beverages for 24 hours.  ACTIVITY: Your care partner should take you home directly after the procedure.  You should plan to take it easy, moving slowly for the rest of the day.  You can resume normal activity the day after the procedure however you should NOT DRIVE or use heavy machinery for 24 hours (because of the sedation medicines used during the test).    SYMPTOMS TO REPORT IMMEDIATELY: A gastroenterologist can be reached at any hour.  During normal business hours, 8:30 AM to 5:00 PM Monday through Friday,  call [phone].  After hours and on weekends, please call the GI answering service at [phone] who will take a message and have the physician on call contact you.   Following lower endoscopy (colonoscopy or flexible sigmoidoscopy):  Excessive amounts of blood in the stool  Significant tenderness or worsening of abdominal pains  Swelling of the abdomen that is new, acute  Fever of 100F or higher  Following upper endoscopy (EGD)  Vomiting of blood or coffee ground material  New chest pain or pain under the shoulder blades  Painful or persistently difficult swallowing  New shortness of breath  Fever of 100F or higher  Black, tarry-looking stools  FOLLOW UP: If any biopsies were taken you will be contacted by phone or by letter within the next 1-3 weeks.  Call your gastroenterologist if you have not heard about the biopsies in 3 weeks.  Our staff will call the home number listed on your records the next business day following your procedure to check on you and address any questions or concerns that you may have at that time regarding the information given to you following your procedure. This is a courtesy call and so if there is no answer at the home number and we have not heard from you through the emergency physician on call, we will assume that you have returned to your regular daily activities without incident.  SIGNATURES/CONFIDENTIALITY: You and/or your care   partner have signed paperwork which will be entered into your electronic medical record.  These signatures attest to the fact that that the information above on your After Visit Summary has been reviewed and is understood.  Full responsibility of the confidentiality of this discharge information lies with you and/or your care-partner.  Polyp, diverticulosis, and high fiber diet information given. 

## 2012-04-30 NOTE — Op Note (Addendum)
[facility] [address] [place], [address]   COLONOSCOPY PROCEDURE REPORT  PATIENT: [name], [name]  MR#: [id number] BIRTHDATE: [birth year] , 55  yrs. old GENDER: Female ENDOSCOPIST: Mardella Layman, MD, [facility] REFERRED BY: PROCEDURE DATE:  04/30/2012 PROCEDURE:   Colonoscopy, screening and Colonoscopy with biopsy ASA CLASS:   Class II INDICATIONS:average risk patient for colon cancer. MEDICATIONS: propofol (Diprivan) 250mg  IV  DESCRIPTION OF PROCEDURE:   After the risks and benefits and of the procedure were explained, informed consent was obtained.  A digital rectal exam revealed no abnormalities of the rectum.    The LB [id number]  endoscope was introduced through the anus and advanced to the cecum, which was identified by both the appendix and ileocecal valve .  The quality of the prep was excellent, using MoviPrep .  The instrument was then slowly withdrawn as the colon was fully examined.     COLON FINDINGS: Mild diverticulosis was noted in the descending colon and sigmoid colon.   A diminutive sessile polyp was found in the sigmoid colon.  A biopsy was performed using a cold snare. The colon mucosa was otherwise normal.     Retroflexed views revealed no abnormalities.     The scope was then withdrawn from the patient and the procedure completed.  COMPLICATIONS: There were no complications. ENDOSCOPIC IMPRESSION: 1.   Mild diverticulosis was noted in the descending colon and sigmoid colon 2.   Diminutive sessile polyp was found in the sigmoid colon; biopsy was performed using a cold snare 3.   The colon mucosa was otherwise normal  RECOMMENDATIONS: 1.  Repeat colonoscopy in 5 years if polyp adenomatous; otherwise 10 years 2.  High fiber diet   REPEAT EXAM:  cc:  _______________________________ eSignedMardella Layman, MD, [facility] 04/30/2012 4:39 PM

## 2012-05-03 ENCOUNTER — Telehealth: Payer: Self-pay | Admitting: *Deleted

## 2012-05-03 NOTE — Telephone Encounter (Signed)
  Follow up Call-  Call back number 04/30/2012  Post procedure Call Back phone  # [phone] cell  Permission to leave phone message Yes     Patient questions:  Do you have a fever, pain , or abdominal swelling? no Pain Score  0 *  Have you tolerated food without any problems? yes  Have you been able to return to your normal activities? yes  Do you have any questions about your discharge instructions: Diet   no Medications  no Follow up visit  no  Do you have questions or concerns about your Care? no  Actions: * If pain score is 4 or above: No action needed, pain <4.

## 2012-05-07 ENCOUNTER — Encounter: Payer: Self-pay | Admitting: Gastroenterology

## 2012-05-10 ENCOUNTER — Encounter: Payer: Self-pay | Admitting: Gastroenterology

## 2012-05-11 ENCOUNTER — Other Ambulatory Visit (HOSPITAL_COMMUNITY): Payer: Self-pay | Admitting: Endocrinology

## 2012-05-11 DIAGNOSIS — E041 Nontoxic single thyroid nodule: Secondary | ICD-10-CM

## 2012-05-13 ENCOUNTER — Other Ambulatory Visit (HOSPITAL_COMMUNITY): Payer: BC Managed Care – PPO

## 2012-05-17 ENCOUNTER — Ambulatory Visit (HOSPITAL_COMMUNITY)
Admission: RE | Admit: 2012-05-17 | Discharge: 2012-05-17 | Disposition: A | Discharge status: Home / Self Care | Payer: BC Managed Care – PPO | Source: Ambulatory Visit | Attending: Endocrinology | Admitting: Endocrinology

## 2012-05-17 DIAGNOSIS — E041 Nontoxic single thyroid nodule: Secondary | ICD-10-CM

## 2012-05-17 DIAGNOSIS — E049 Nontoxic goiter, unspecified: Secondary | ICD-10-CM | POA: Insufficient documentation

## 2013-03-06 ENCOUNTER — Other Ambulatory Visit: Payer: Self-pay | Admitting: Neurology

## 2013-03-14 ENCOUNTER — Other Ambulatory Visit: Payer: Self-pay | Admitting: Gynecology

## 2013-03-14 DIAGNOSIS — R928 Other abnormal and inconclusive findings on diagnostic imaging of breast: Secondary | ICD-10-CM

## 2013-03-25 ENCOUNTER — Ambulatory Visit
Admission: RE | Admit: 2013-03-25 | Discharge: 2013-03-25 | Disposition: A | Discharge status: Home / Self Care | Payer: BC Managed Care – PPO | Source: Ambulatory Visit | Attending: Gynecology | Admitting: Gynecology

## 2013-03-25 DIAGNOSIS — R928 Other abnormal and inconclusive findings on diagnostic imaging of breast: Secondary | ICD-10-CM

## 2013-05-04 ENCOUNTER — Other Ambulatory Visit (HOSPITAL_COMMUNITY): Payer: Self-pay | Admitting: Endocrinology

## 2013-05-04 DIAGNOSIS — R221 Localized swelling, mass and lump, neck: Secondary | ICD-10-CM

## 2013-05-06 ENCOUNTER — Ambulatory Visit (HOSPITAL_COMMUNITY)
Admission: RE | Admit: 2013-05-06 | Discharge: 2013-05-06 | Disposition: A | Discharge status: Home / Self Care | Payer: BC Managed Care – PPO | Source: Ambulatory Visit | Attending: Endocrinology | Admitting: Endocrinology

## 2013-05-06 DIAGNOSIS — E041 Nontoxic single thyroid nodule: Secondary | ICD-10-CM | POA: Insufficient documentation

## 2013-05-06 DIAGNOSIS — R221 Localized swelling, mass and lump, neck: Secondary | ICD-10-CM

## 2013-11-08 ENCOUNTER — Ambulatory Visit (INDEPENDENT_AMBULATORY_CARE_PROVIDER_SITE_OTHER): Payer: BC Managed Care – PPO | Admitting: Urology

## 2013-11-08 ENCOUNTER — Other Ambulatory Visit: Payer: Self-pay | Admitting: Urology

## 2013-11-08 DIAGNOSIS — R350 Frequency of micturition: Secondary | ICD-10-CM

## 2013-11-08 DIAGNOSIS — R109 Unspecified abdominal pain: Secondary | ICD-10-CM

## 2013-11-11 ENCOUNTER — Ambulatory Visit (HOSPITAL_COMMUNITY)
Admission: RE | Admit: 2013-11-11 | Discharge: 2013-11-11 | Disposition: A | Discharge status: Home / Self Care | Payer: BC Managed Care – PPO | Source: Ambulatory Visit | Attending: Urology | Admitting: Urology

## 2013-11-11 DIAGNOSIS — R109 Unspecified abdominal pain: Secondary | ICD-10-CM | POA: Insufficient documentation

## 2013-11-11 DIAGNOSIS — Q619 Cystic kidney disease, unspecified: Secondary | ICD-10-CM | POA: Insufficient documentation

## 2013-11-15 ENCOUNTER — Other Ambulatory Visit: Payer: Self-pay | Admitting: Urology

## 2013-11-15 DIAGNOSIS — D49519 Neoplasm of unspecified behavior of unspecified kidney: Secondary | ICD-10-CM

## 2013-11-18 ENCOUNTER — Ambulatory Visit (HOSPITAL_COMMUNITY)
Admission: RE | Admit: 2013-11-18 | Discharge: 2013-11-18 | Disposition: A | Discharge status: Home / Self Care | Payer: BC Managed Care – PPO | Source: Ambulatory Visit | Attending: Urology | Admitting: Urology

## 2013-11-18 ENCOUNTER — Encounter (HOSPITAL_COMMUNITY): Payer: Self-pay

## 2013-11-18 DIAGNOSIS — D49519 Neoplasm of unspecified behavior of unspecified kidney: Secondary | ICD-10-CM

## 2013-11-18 DIAGNOSIS — N289 Disorder of kidney and ureter, unspecified: Secondary | ICD-10-CM | POA: Insufficient documentation

## 2013-11-18 DIAGNOSIS — Q6101 Congenital single renal cyst: Secondary | ICD-10-CM | POA: Insufficient documentation

## 2013-11-18 DIAGNOSIS — K7689 Other specified diseases of liver: Secondary | ICD-10-CM | POA: Insufficient documentation

## 2013-11-18 DIAGNOSIS — R1032 Left lower quadrant pain: Secondary | ICD-10-CM | POA: Insufficient documentation

## 2013-11-18 MED ORDER — GADOBENATE DIMEGLUMINE 529 MG/ML IV SOLN
13.0000 mL | Freq: Once | INTRAVENOUS | Status: AC | PRN
  Administered 2013-11-18: 13 mL via INTRAVENOUS

## 2014-02-08 ENCOUNTER — Telehealth: Payer: Self-pay | Admitting: Neurology

## 2014-02-08 MED ORDER — TOPIRAMATE 25 MG PO TABS: 50.0000 mg | ORAL_TABLET | Freq: Every evening | ORAL | Status: DC

## 2014-02-08 MED ORDER — SUMATRIPTAN SUCCINATE 100 MG PO TABS: 100.0000 mg | ORAL_TABLET | ORAL | Status: DC | PRN

## 2014-02-08 NOTE — Telephone Encounter (Signed)
Patient has an appt scheduled in Dec.  Rx's have been sent.  I called the patient.  She is aware.

## 2014-02-08 NOTE — Telephone Encounter (Signed)
Pt called states she is requesting a refill on her SUMAtriptan (IMITREX) 100 MG tablet and her Topamax 25 mg. I don't see in the system where she was on this or her last visit so it will need to be pulled from the old system. I have schedule pt to come in for her yearly visit. Please call pt once medication has been called in. Thanks

## 2014-05-09 ENCOUNTER — Other Ambulatory Visit (HOSPITAL_COMMUNITY): Payer: Self-pay | Admitting: Endocrinology

## 2014-05-09 DIAGNOSIS — R221 Localized swelling, mass and lump, neck: Secondary | ICD-10-CM

## 2014-05-12 ENCOUNTER — Ambulatory Visit (HOSPITAL_COMMUNITY)
Admission: RE | Admit: 2014-05-12 | Discharge: 2014-05-12 | Disposition: A | Discharge status: Home / Self Care | Payer: BC Managed Care – PPO | Source: Ambulatory Visit | Attending: Endocrinology | Admitting: Endocrinology

## 2014-05-12 DIAGNOSIS — R221 Localized swelling, mass and lump, neck: Secondary | ICD-10-CM

## 2014-05-12 DIAGNOSIS — R220 Localized swelling, mass and lump, head: Secondary | ICD-10-CM | POA: Insufficient documentation

## 2014-05-12 DIAGNOSIS — E042 Nontoxic multinodular goiter: Secondary | ICD-10-CM | POA: Insufficient documentation

## 2014-07-11 ENCOUNTER — Other Ambulatory Visit: Payer: Self-pay | Admitting: Neurology

## 2014-07-12 NOTE — Telephone Encounter (Signed)
Patient has appt next month

## 2014-08-01 ENCOUNTER — Encounter: Payer: Self-pay | Admitting: Neurology

## 2014-08-01 ENCOUNTER — Ambulatory Visit (INDEPENDENT_AMBULATORY_CARE_PROVIDER_SITE_OTHER): Payer: BC Managed Care – PPO | Admitting: Neurology

## 2014-08-01 VITALS — BP 110/73 | HR 74 | Ht 68.0 in | Wt 149.8 lb

## 2014-08-01 DIAGNOSIS — G43909 Migraine, unspecified, not intractable, without status migrainosus: Secondary | ICD-10-CM | POA: Insufficient documentation

## 2014-08-01 DIAGNOSIS — G43009 Migraine without aura, not intractable, without status migrainosus: Secondary | ICD-10-CM

## 2014-08-01 DIAGNOSIS — G43019 Migraine without aura, intractable, without status migrainosus: Secondary | ICD-10-CM

## 2014-08-01 HISTORY — DX: Migraine without aura, not intractable, without status migrainosus: G43.009

## 2014-08-01 MED ORDER — TOPIRAMATE 100 MG PO TABS: 100.0000 mg | ORAL_TABLET | Freq: Every day | ORAL | Status: DC

## 2014-08-01 MED ORDER — RIZATRIPTAN BENZOATE 10 MG PO TBDP: 10.0000 mg | ORAL_TABLET | Freq: Three times a day (TID) | ORAL | Status: DC | PRN

## 2014-08-01 NOTE — Progress Notes (Signed)
Reason for visit: Migraine headache  [name] is an 58 y.o. female  History of present illness:  Ms. [name] is a 58 year old right-handed white female with migraine headache. The patient has continued to have headaches on average 2 or 3 times a week. The patient is taking Imitrex, and she indicates that she can limit the headache to about 1 or 2 hours, but she always has to take more than one Imitrex tablet. 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 She indicates that the headaches rarely prevent her from doing her work. The patient denies any new medical issues that have come up since last seen. She has been on Topamax taking 50 mg at night, and she is not sure whether this has helped her headaches are not. The patient returns for an evaluation.  Past Medical History  Diagnosis Date  . Arthritis   . GERD (gastroesophageal reflux disease)   . Osteoporosis   . Common migraine 08/01/2014  . History of hepatitis A   . Thyroid nodule     Past Surgical History  Procedure Laterality Date  . Vaginal hysterectomy    . Back surgery  12/2006    Lumbosacral spine  . Thyroid lumpectomy  1981  . Tonsillectomy    . Nasal sinus surgery      Family History  Problem Relation Age of Onset  . Stomach cancer Maternal Grandmother   . Colon cancer Neg Hx   . Esophageal cancer Neg Hx   . Rectal cancer Neg Hx   . Diabetes Mother   . COPD Mother   . Hypertension Mother   . Heart attack Father   . Migraines Father   . Migraines Sister     Social history:  reports that she quit smoking about 35 years ago. She has never used smokeless tobacco. She reports that she drinks about 1.5 oz of alcohol per week. She reports that she does not use illicit drugs.   No Known Allergies  Medications:  Current Outpatient Prescriptions on File Prior to Visit  Medication Sig Dispense Refill  . alendronate (FOSAMAX) 70 MG tablet Take 70 mg by mouth every  7 (seven) days. Take with a full glass of water on an empty stomach.    . Calcium-Vitamin D-Vitamin K (VIACTIV PO) Take 3 tablets by mouth daily.     No current facility-administered medications on file prior to visit.    ROS:  Out of a complete 14 system review of symptoms, the patient complains only of the following symptoms, and all other reviewed systems are negative.  Ringing in the ears Eyes itching Flushing Swollen abdomen, abdominal pain, constipation Joint pain, achy muscles, neck pain, neck stiffness Bruising easily Headache, weakness, tremors Agitation  Blood pressure 110/73, pulse 74, height 5\' 8"  (1.727 m), weight 149 lb 12.8 oz (67.949 kg).  Physical Exam  General: The patient is alert and cooperative at the time of the examination.  Skin: No significant peripheral edema is noted.   Neurologic Exam  Mental status: The patient is oriented x 3.  Cranial nerves: Facial symmetry is present. Speech is normal, no aphasia or dysarthria is noted. Extraocular movements are full. Visual fields are full.  Motor: The patient has good strength in all 4 extremities.  Sensory examination: Soft touch sensation on the face, arms, and legs is symmetric.  Coordination: The patient has good finger-nose-finger and heel-to-shin bilaterally.  Gait and station: The patient has a normal  gait. Tandem gait is normal. Romberg is negative. No drift is seen.  Reflexes: Deep tendon reflexes are symmetric.   Assessment/Plan:  1. Common migraine  The patient continues to have relatively frequent headaches. In the past, she was indicates that weather changes, and certain odors or perfumes may bring on headaches. The patient will go up on the Topamax dose to 75 mg daily for one week, then go to 100 mg daily. The patient will be switched from Imitrex to Maxalt to see if this works better. She will follow-up in 6 months. The patient will contact me if she is not doing well with her  headache.  Jill Alexanders MD 08/01/2014 8:23 AM  [facility] [address] [place], [place] [address]  Phone [phone] Fax [phone]

## 2014-08-01 NOTE — Patient Instructions (Signed)

## 2014-08-02 ENCOUNTER — Telehealth: Payer: Self-pay | Admitting: Neurology

## 2014-08-02 NOTE — Telephone Encounter (Signed)
I called back.  Spoke with [facility].  She was not able to assist me and transferred me to [facility].  I verified Rx.  They will proceed with order, and will call us back if anything further is needed.

## 2014-08-02 NOTE — Telephone Encounter (Signed)
Jenna from [facility] is calling regarding instructions given for rizatriptan (MAXALT-MLT) 10 MG disintegrating tablet.  She states the instructions are for the patient to take 3 times a day.  Darron Doom said that this is not reccommended it should state take as needed.  Please call with correct instructions.  Please call [phone] use reference# [id number].

## 2015-02-07 ENCOUNTER — Ambulatory Visit (INDEPENDENT_AMBULATORY_CARE_PROVIDER_SITE_OTHER): Payer: BLUE CROSS/BLUE SHIELD | Admitting: Nurse Practitioner

## 2015-02-07 ENCOUNTER — Encounter: Payer: Self-pay | Admitting: Nurse Practitioner

## 2015-02-07 VITALS — BP 116/75 | HR 73 | Ht 68.0 in | Wt 149.0 lb

## 2015-02-07 DIAGNOSIS — G43019 Migraine without aura, intractable, without status migrainosus: Secondary | ICD-10-CM | POA: Diagnosis not present

## 2015-02-07 MED ORDER — RIZATRIPTAN BENZOATE 10 MG PO TBDP: 10.0000 mg | ORAL_TABLET | ORAL | Status: DC | PRN

## 2015-02-07 MED ORDER — TOPIRAMATE 50 MG PO TABS: 50.0000 mg | ORAL_TABLET | Freq: Two times a day (BID) | ORAL | Status: DC

## 2015-02-07 NOTE — Progress Notes (Signed)
[facility]  PATIENT: [name] DOB: September 15, 1955   REASON FOR VISIT: Follow-up for migraine HISTORY FROM: Patient    HISTORY OF PRESENT ILLNESS:[name] is a 59 year old right-handed white female with migraine headache returns for follow-up. She was last seen in this office 08/01/2014 by Dr. Jannifer Franklin. The patient has continued to have increased headaches with weather changes, 1-2 per week .She is currently taking Maxalt acutely which works.  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 She indicates that the headaches rarely prevent her from doing her work. She is currently taking Topamax 75 mg daily The patient denies any new medical issues that have come up since last seen. The patient returns for an evaluation.   REVIEW OF SYSTEMS: Full 14 system review of systems performed and notable only for those listed, all others are neg:  Constitutional: neg  Cardiovascular: neg Ear/Nose/Throat: neg  Skin: neg Eyes: neg Respiratory: neg Gastroitestinal: neg  Hematology/Lymphatic: neg  Endocrine: neg Musculoskeletal:neg Allergy/Immunology: neg Neurological: Headache Psychiatric: Anxiety Sleep : neg   ALLERGIES: No Known Allergies  HOME MEDICATIONS: Outpatient Prescriptions Prior to Visit  Medication Sig Dispense Refill  . alendronate (FOSAMAX) 70 MG tablet Take 70 mg by mouth every 7 (seven) days. Take with a full glass of water on an empty stomach.    . Calcium-Vitamin D-Vitamin K (VIACTIV PO) Take 3 tablets by mouth daily.    . pantoprazole (PROTONIX) 20 MG tablet Take 20 mg by mouth daily.    . rizatriptan (MAXALT-MLT) 10 MG disintegrating tablet Take 1 tablet (10 mg total) by mouth 3 (three) times daily as needed for migraine. May repeat in 2 hours if needed 27 tablet 3  . topiramate (TOPAMAX) 100 MG tablet Take 1 tablet (100 mg total) by mouth at bedtime. (Patient taking differently: Take 75 mg by mouth at bedtime.  ) 90 tablet 3  . SOOLANTRA 1 % CREA      No facility-administered medications prior to visit.    PAST MEDICAL HISTORY: Past Medical History  Diagnosis Date  . Arthritis   . GERD (gastroesophageal reflux disease)   . Osteoporosis   . Common migraine 08/01/2014  . History of hepatitis A   . Thyroid nodule     PAST SURGICAL HISTORY: Past Surgical History  Procedure Laterality Date  . Vaginal hysterectomy    . Back surgery  12/2006    Lumbosacral spine  . Thyroid lumpectomy  1981  . Tonsillectomy    . Nasal sinus surgery      FAMILY HISTORY: Family History  Problem Relation Age of Onset  . Stomach cancer Maternal Grandmother   . Colon cancer Neg Hx   . Esophageal cancer Neg Hx   . Rectal cancer Neg Hx   . Diabetes Mother   . COPD Mother   . Hypertension Mother   . Heart attack Father   . Migraines Father   . Migraines Sister     SOCIAL HISTORY: History   Social History  . Marital Status: Married    Spouse Name: N/A  . Number of Children: N/A  . Years of Education: N/A   Occupational History  . Not on file.   Social History Main Topics  . Smoking status: Former Smoker    Quit date: 08/25/1978  . Smokeless tobacco: Never Used  . Alcohol Use: 1.5 oz/week    3 Standard drinks or equivalent per week     Comment: occ  . Drug Use:  No  . Sexual Activity: Not on file   Other Topics Concern  . Not on file   Social History Narrative     PHYSICAL EXAM  Filed Vitals:   02/07/15 0853  BP: 116/75  Pulse: 73  Height: 5\' 8"  (1.727 m)  Weight: 149 lb (67.586 kg)   Body mass index is 22.66 kg/(m^2). General: The patient is alert and cooperative at the time of the examination. Skin: No significant peripheral edema is noted.  Neurologic Exam Mental status: The patient is oriented x 3. Follows all commands Cranial nerves: Facial symmetry is present. Speech is normal, no aphasia or dysarthria is noted. PERL.Extraocular movements are full. Visual fields are  full. Motor: The patient has good strength in all 4 extremities. Sensory examination: Soft touch sensation on the face, arms, and legs is symmetric. Coordination: The patient has good finger-nose-finger and heel-to-shin bilaterally. Gait and station: The patient has a normal gait. Tandem gait is normal. Romberg is negative. No drift is seen. Reflexes: Deep tendon reflexes are symmetric. DIAGNOSTIC DATA (LABS, IMAGING, TESTING) -  ASSESSMENT AND PLAN  59 y.o. year old female  has a past medical history of  Common migraine (08/01/2014);  here to follow-up. Patient is currently having one to 2 headaches per week with weather changes, she is currently taking Topamax 75 daily.  Increase Topamax to 50 mg twice daily will renew through mail order Continue Maxalt acutely will refill Given list of  migraine triggers and reviewed these with her Follow-up in 6-8 months Dennie Ware, [name], [facility], [facility] [address] [place], [place] [address] [phone]

## 2015-02-07 NOTE — Progress Notes (Signed)
I have read the note, and I agree with the clinical assessment and plan.  Kristiana Jacko KEITH   

## 2015-02-07 NOTE — Patient Instructions (Signed)
Increase Topamax to 50 mg twice daily will renew through mail order Continue Maxalt acutely Given list of  migraine triggers Follow-up in 6-8 months

## 2015-04-11 ENCOUNTER — Other Ambulatory Visit: Payer: Self-pay | Admitting: Gynecology

## 2015-04-12 LAB — CYTOLOGY - PAP

## 2015-04-25 ENCOUNTER — Encounter: Payer: Self-pay | Admitting: Gastroenterology

## 2015-05-08 ENCOUNTER — Other Ambulatory Visit (HOSPITAL_COMMUNITY): Payer: Self-pay | Admitting: Endocrinology

## 2015-05-08 DIAGNOSIS — E041 Nontoxic single thyroid nodule: Secondary | ICD-10-CM

## 2015-05-10 ENCOUNTER — Encounter: Payer: Self-pay | Admitting: Gastroenterology

## 2015-05-10 ENCOUNTER — Ambulatory Visit (HOSPITAL_COMMUNITY)
Admission: RE | Admit: 2015-05-10 | Discharge: 2015-05-10 | Disposition: A | Discharge status: Home / Self Care | Payer: BLUE CROSS/BLUE SHIELD | Source: Ambulatory Visit | Attending: Endocrinology | Admitting: Endocrinology

## 2015-05-10 DIAGNOSIS — E041 Nontoxic single thyroid nodule: Secondary | ICD-10-CM

## 2015-05-10 DIAGNOSIS — E042 Nontoxic multinodular goiter: Secondary | ICD-10-CM | POA: Diagnosis present

## 2015-05-11 ENCOUNTER — Encounter: Payer: Self-pay | Admitting: Gastroenterology

## 2015-05-15 ENCOUNTER — Ambulatory Visit (INDEPENDENT_AMBULATORY_CARE_PROVIDER_SITE_OTHER): Payer: BLUE CROSS/BLUE SHIELD | Admitting: Gastroenterology

## 2015-05-15 ENCOUNTER — Encounter: Payer: Self-pay | Admitting: Gastroenterology

## 2015-05-15 VITALS — BP 106/70 | HR 68 | Ht 68.0 in | Wt 146.0 lb

## 2015-05-15 DIAGNOSIS — K219 Gastro-esophageal reflux disease without esophagitis: Secondary | ICD-10-CM | POA: Diagnosis not present

## 2015-05-15 DIAGNOSIS — K5900 Constipation, unspecified: Secondary | ICD-10-CM | POA: Insufficient documentation

## 2015-05-15 DIAGNOSIS — R1310 Dysphagia, unspecified: Secondary | ICD-10-CM | POA: Insufficient documentation

## 2015-05-15 DIAGNOSIS — Z8719 Personal history of other diseases of the digestive system: Secondary | ICD-10-CM | POA: Insufficient documentation

## 2015-05-15 DIAGNOSIS — R195 Other fecal abnormalities: Secondary | ICD-10-CM | POA: Diagnosis not present

## 2015-05-15 MED ORDER — PANTOPRAZOLE SODIUM 40 MG PO TBEC: 40.0000 mg | DELAYED_RELEASE_TABLET | Freq: Every day | ORAL | Status: DC

## 2015-05-15 NOTE — Progress Notes (Signed)
05/15/2015 [name] [id number] 1955-11-27   HISTORY OF PRESENT ILLNESS:  This is a 59 year old female who is previously known to Dr. Sharlett Iles.  She had a colonoscopy in September 2013 at which time she was found have diverticulosis and one polyp that was removed and was hyperplastic on pathology.  She was sent here today by her gynecologist, Dr. Carren Rang, for evaluation of heme positive stool.  Patient had routine exam by GYN and they gave her hemoccult cards to perform.  One out of 3 was positive.  She knows that she has hemorrhoids and has had bright red bleeding associated with them on occasion (bright red blood on TP after BM) so she figured that may have been the issue.  She admits to chronic constipation for which she uses small doses of magnesium citrate weekly prn.  While she was here she also mentioned that she does have some upper GI issues. She complains of reflux/indigestion and was having problems swallowing solid food previously as well. She had an EGD in October 2003 at which time she was found have a hiatal hernia and distal esophageal stricture that was dilated with Maloney dilator to 17 mm.  She says that she's been taking Protonix 20 mg daily for several years and that has relieved some of her symptoms, however, she does still have symptoms.   Past Medical History  Diagnosis Date  . Arthritis   . GERD (gastroesophageal reflux disease)   . Osteoporosis   . Common migraine 08/01/2014  . History of hepatitis A   . Thyroid nodule   . Diverticulosis    Past Surgical History  Procedure Laterality Date  . Vaginal hysterectomy    . Back surgery  12/2006    Lumbosacral spine  . Thyroid lumpectomy  1981  . Tonsillectomy    . Nasal sinus surgery      reports that she quit smoking about 36 years ago. She has never used smokeless tobacco. She reports that she drinks about 1.5 oz of alcohol per week. She reports that she does not use illicit drugs. family history  includes COPD in her mother; Diabetes in her mother; Heart attack in her father; Hypertension in her mother; Migraines in her father and sister; Other in an other family member; Stomach cancer in her maternal grandmother. There is no history of Colon cancer, Esophageal cancer, or Rectal cancer. No Known Allergies    Outpatient Encounter Prescriptions as of 05/15/2015  Medication Sig  . alendronate (FOSAMAX) 70 MG tablet Take 70 mg by mouth every 7 (seven) days. Take with a full glass of water on an empty stomach.  . Calcium-Vitamin D-Vitamin K (VIACTIV PO) Take 3 tablets by mouth daily.  . pantoprazole (PROTONIX) 20 MG tablet Take 20 mg by mouth daily.  . rizatriptan (MAXALT-MLT) 10 MG disintegrating tablet Take 1 tablet (10 mg total) by mouth as needed for migraine. May repeat in 2 hours if needed  . topiramate (TOPAMAX) 50 MG tablet Take 1 tablet (50 mg total) by mouth 2 (two) times daily.   No facility-administered encounter medications on file as of 05/15/2015.     REVIEW OF SYSTEMS  : All other systems reviewed and negative except where noted in the History of Present Illness.   PHYSICAL EXAM: BP 106/70 mmHg  Pulse 68  Ht 5\' 8"  (1.727 m)  Wt 146 lb (66.225 kg)  BMI 22.20 kg/m2 General: Well developed white female in no acute distress Head: Normocephalic and  atraumatic Eyes:  Sclerae anicteric, conjunctiva pink. Ears: Normal auditory acuity Lungs: Clear throughout to auscultation Heart: Regular rate and rhythm Abdomen: Soft, non-distended.  Normal bowel sounds.  Non-tender. Musculoskeletal: Symmetrical with no gross deformities  Skin: No lesions on visible extremities Extremities: No edema  Neurological: Alert oriented x 4, grossly non-focal Psychological:  Alert and cooperative. Normal mood and affect  ASSESSMENT AND PLAN: -59 year old female with heme positive stools x 1.  Colonoscopy is up-to-date.  Has history of hemorrhoids with bleeding. -GERD with dysphagia and  history of esophageal stricture with dilation in 2003.  Is on pantoprazole 20 mg daily for quite some time with some improvement in symptoms, but no complete resolution.  Will increase dose to 40 mg daily -Constipation:  Will try Miralax daily.  *Heme positive stools are very likely secondary to hemorrhoids, but with UGI symptoms/complaints, I think that it would be appropriate to perform EGD.  The risks, benefits, and alternatives to EGD were discussed with the patient and she consents to proceed.    CC:  Sharilyn Sites, MD

## 2015-05-15 NOTE — Patient Instructions (Signed)
We sent a prescription to the mail order pharmacy for Pantoprazole sodium 40 mg, once daily. You have been scheduled for an endoscopy. Please follow written instructions given to you at your visit today. If you use inhalers (even only as needed), please bring them with you on the day of your procedure. Your physician has requested that you go to www.startemmi.com and enter the access code given to you at your visit today. This web site gives a general overview about your procedure. However, you should still follow specific instructions given to you by our office regarding your preparation for the procedure.

## 2015-05-16 ENCOUNTER — Encounter: Payer: Self-pay | Admitting: Internal Medicine

## 2015-05-16 ENCOUNTER — Ambulatory Visit (AMBULATORY_SURGERY_CENTER): Payer: BLUE CROSS/BLUE SHIELD | Admitting: Internal Medicine

## 2015-05-16 VITALS — BP 110/57 | HR 57 | Temp 97.4°F | Resp 12 | Ht 68.0 in | Wt 146.0 lb

## 2015-05-16 DIAGNOSIS — R1310 Dysphagia, unspecified: Secondary | ICD-10-CM | POA: Diagnosis not present

## 2015-05-16 DIAGNOSIS — K219 Gastro-esophageal reflux disease without esophagitis: Secondary | ICD-10-CM

## 2015-05-16 DIAGNOSIS — K222 Esophageal obstruction: Secondary | ICD-10-CM | POA: Diagnosis not present

## 2015-05-16 MED ORDER — SODIUM CHLORIDE 0.9 % IV SOLN: 500.0000 mL | INTRAVENOUS | Status: DC

## 2015-05-16 NOTE — Progress Notes (Signed)
Agree with Ms. Alphia Kava management.  Gatha Mayer, MD, [facility]  Esophageal stricture dilated today - she will f/u me re: this, hemorrhoids/heme + and constipation. Benefiber +/- MiraLAx recommended today.  Marland Kitchen

## 2015-05-16 NOTE — Progress Notes (Signed)
Patient awakening,vss,report to rn 

## 2015-05-16 NOTE — Op Note (Signed)
[place]  [employer]. [place], [address]   ENDOSCOPY PROCEDURE REPORT  PATIENT: Tammy, Ware  MR#: [id number] BIRTHDATE: 04/07/1956 , 13  yrs. old GENDER: female ENDOSCOPIST: Gatha Mayer, MD, [facility] PROCEDURE DATE:  05/16/2015 PROCEDURE:  EGD w/ balloon dilation ASA CLASS:     Class II INDICATIONS:  dysphagia and therapeutic procedure. MEDICATIONS: Propofol 200 mg IV and Monitored anesthesia care TOPICAL ANESTHETIC: none  DESCRIPTION OF PROCEDURE: After the risks benefits and alternatives of the procedure were thoroughly explained, informed consent was obtained.  The LB [id number] endoscope was introduced through the mouth and advanced to the second portion of the duodenum , Without limitations.  The instrument was slowly withdrawn as the mucosa was fully examined.    Ring-like stricture at GE junction - otherwise normal EGD.  balloon dilation 16-17-18 mm performed with good result - slight heme and successful dilation.  Retroflexed views revealed no abnormalities. The scope was then withdrawn from the patient and the procedure completed.  COMPLICATIONS: There were no immediate complications.  ENDOSCOPIC IMPRESSION: Ring-like stricture at GE junction - otherwise normal EGD.  balloon dilation 16-17-18 mm performed with good result - slight heme and successful dilation  RECOMMENDATIONS: 1.  Clear liquids until 0900, then soft foods rest of day.  Resume prior diet tomorrow. 2.  Call soon and get 2 month f/u appointment w/ Dr.  Carlean Purl  - f/u dysphagia, GERD, hemorrhoids, constipation and heme + stool   eSigned:  Gatha Mayer, MD, [facility] 05/16/2015 8:00 AM    CC:The Patient, Delila Pereyra, MD and Sharilyn Sites, MD

## 2015-05-16 NOTE — Patient Instructions (Addendum)
   I dilated the esophagus - it all looks ok otherwise. Let's see how the increased dose of acid-blocking medicine does.  Please call soon and schedule a follow-up to see me in late Nov or December.  I appreciate the opportunity to care for you. Gatha Mayer, MD, FACG      YOU HAD AN ENDOSCOPIC PROCEDURE TODAY AT [facility]:   Refer to the procedure report that was given to you for any specific questions about what was found during the examination.  If the procedure report does not answer your questions, please call your gastroenterologist to clarify.  If you requested that your care partner not be given the details of your procedure findings, then the procedure report has been included in a sealed envelope for you to review at your convenience later.  YOU SHOULD EXPECT: Some feelings of bloating in the abdomen. Passage of more gas than usual.  Walking can help get rid of the air that was put into your GI tract during the procedure and reduce the bloating. If you had a lower endoscopy (such as a colonoscopy or flexible sigmoidoscopy) you may notice spotting of blood in your stool or on the toilet paper. If you underwent a bowel prep for your procedure, you may not have a normal bowel movement for a few days.  Please Note:  You might notice some irritation and congestion in your nose or some drainage.  This is from the oxygen used during your procedure.  There is no need for concern and it should clear up in a day or so.  SYMPTOMS TO REPORT IMMEDIATELY:    Following upper endoscopy (EGD)  Vomiting of blood or coffee ground material  New chest pain or pain under the shoulder blades  Painful or persistently difficult swallowing  New shortness of breath  Fever of 100F or higher  Black, tarry-looking stools  For urgent or emergent issues, a gastroenterologist can be reached at any hour by calling [phone].   DIET:  Follow Dilation Handout  ACTIVITY:   You should plan to take it easy for the rest of today and you should NOT DRIVE or use heavy machinery until tomorrow (because of the sedation medicines used during the test).    FOLLOW UP: Our staff will call the number listed on your records the next business day following your procedure to check on you and address any questions or concerns that you may have regarding the information given to you following your procedure. If we do not reach you, we will leave a message.  However, if you are feeling well and you are not experiencing any problems, there is no need to return our call.  We will assume that you have returned to your regular daily activities without incident.  If any biopsies were taken you will be contacted by phone or by letter within the next 1-3 weeks.  Please call us at [phone] if you have not heard about the biopsies in 3 weeks.    SIGNATURES/CONFIDENTIALITY: You and/or your care partner have signed paperwork which will be entered into your electronic medical record.  These signatures attest to the fact that that the information above on your After Visit Summary has been reviewed and is understood.  Full responsibility of the confidentiality of this discharge information lies with you and/or your care-partner.   Resume medications. Information given on high fiber diet and Dilation Diet.

## 2015-05-16 NOTE — Progress Notes (Signed)
Called to room to assist during endoscopic procedure.  Patient ID and intended procedure confirmed with present staff. Received instructions for my participation in the procedure from the performing physician.  

## 2015-05-17 ENCOUNTER — Telehealth: Payer: Self-pay

## 2015-05-17 NOTE — Telephone Encounter (Signed)
Left message on answering machine. 

## 2015-06-19 ENCOUNTER — Encounter: Payer: Self-pay | Admitting: Gastroenterology

## 2015-07-16 ENCOUNTER — Other Ambulatory Visit: Payer: Self-pay | Admitting: Dermatology

## 2015-08-22 ENCOUNTER — Other Ambulatory Visit (HOSPITAL_COMMUNITY): Payer: Self-pay | Admitting: Endocrinology

## 2015-08-22 DIAGNOSIS — E041 Nontoxic single thyroid nodule: Secondary | ICD-10-CM

## 2015-09-12 ENCOUNTER — Ambulatory Visit (INDEPENDENT_AMBULATORY_CARE_PROVIDER_SITE_OTHER): Payer: BLUE CROSS/BLUE SHIELD | Admitting: Nurse Practitioner

## 2015-09-12 ENCOUNTER — Encounter: Payer: Self-pay | Admitting: Nurse Practitioner

## 2015-09-12 VITALS — BP 111/76 | HR 63 | Ht 68.0 in | Wt 149.0 lb

## 2015-09-12 DIAGNOSIS — G43019 Migraine without aura, intractable, without status migrainosus: Secondary | ICD-10-CM

## 2015-09-12 MED ORDER — TOPIRAMATE 50 MG PO TABS: 50.0000 mg | ORAL_TABLET | Freq: Two times a day (BID) | ORAL | Status: DC

## 2015-09-12 MED ORDER — RIZATRIPTAN BENZOATE 10 MG PO TBDP: 10.0000 mg | ORAL_TABLET | ORAL | Status: DC | PRN

## 2015-09-12 NOTE — Progress Notes (Signed)
[facility]  PATIENT: [name] DOB: [birth year]   REASON FOR VISIT: Follow-up for migraines HISTORY FROM: Patient    HISTORY OF PRESENT ILLNESS:[name] is a 59 year old right-handed white female with migraine headache returns for follow-up. She was last seen in this office 02/07/15 The patient has  increased headaches with weather changes, 1-2 per week .She is currently taking Maxalt acutely which works. 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 She indicates that the headaches rarely prevent her from doing her work. She is currently taking Topamax 50mg  BID. The patient denies any new medical issues that have come up since last seen. The patient returns for an evaluation.   REVIEW OF SYSTEMS: Full 14 system review of systems performed and notable only for those listed, all others are neg:  Constitutional: neg  Cardiovascular: neg Ear/Nose/Throat: neg  Skin: neg Eyes: neg Respiratory: neg Gastroitestinal: neg  Hematology/Lymphatic: neg  Endocrine: neg Musculoskeletal:neg Allergy/Immunology: neg Neurological: neg Psychiatric: neg Sleep : neg   ALLERGIES: No Known Allergies  HOME MEDICATIONS: Outpatient Prescriptions Prior to Visit  Medication Sig Dispense Refill  . alendronate (FOSAMAX) 70 MG tablet Take 70 mg by mouth every 7 (seven) days. Take with a full glass of water on an empty stomach.    . Calcium-Vitamin D-Vitamin K (VIACTIV PO) Take 3 tablets by mouth daily.    . pantoprazole (PROTONIX) 40 MG tablet Take 1 tablet (40 mg total) by mouth daily. 90 tablet 3  . rizatriptan (MAXALT-MLT) 10 MG disintegrating tablet Take 1 tablet (10 mg total) by mouth as needed for migraine. May repeat in 2 hours if needed 27 tablet 3  . topiramate (TOPAMAX) 50 MG tablet Take 1 tablet (50 mg total) by mouth 2 (two) times daily. 180 tablet 2  . pantoprazole (PROTONIX) 20 MG tablet Take 20 mg by mouth daily.     No  facility-administered medications prior to visit.    PAST MEDICAL HISTORY: Past Medical History  Diagnosis Date  . Arthritis   . GERD (gastroesophageal reflux disease)   . Osteoporosis   . Common migraine 08/01/2014  . History of hepatitis A   . Diverticulosis   . Thyroid nodule     per recent US, has enlarged, but being monitored now    PAST SURGICAL HISTORY: Past Surgical History  Procedure Laterality Date  . Vaginal hysterectomy    . Back surgery  12/2006    Lumbosacral spine  . Thyroid lumpectomy  1981  . Tonsillectomy    . Nasal sinus surgery    . Upper gastrointestinal endoscopy    . Colonoscopy      FAMILY HISTORY: Family History  Problem Relation Age of Onset  . Stomach cancer Maternal Grandmother   . Colon cancer Neg Hx   . Esophageal cancer Neg Hx   . Rectal cancer Neg Hx   . Diabetes Mother   . COPD Mother   . Hypertension Mother   . Heart attack Father   . Migraines Father   . Migraines Sister   . Other      brain tumor    SOCIAL HISTORY: Social History   Social History  . Marital Status: Married    Spouse Name: N/A  . Number of Children: 1  . Years of Education: N/A   Occupational History  . retired    Social History Main Topics  . Smoking status: Former Smoker    Quit date: 08/25/1978  . Smokeless tobacco:  Never Used  . Alcohol Use: 1.5 oz/week    3 Standard drinks or equivalent per week     Comment: occ  . Drug Use: No  . Sexual Activity: Not on file   Other Topics Concern  . Not on file   Social History Narrative     PHYSICAL EXAM  Filed Vitals:   09/12/15 1254  BP: 111/76  Pulse: 63  Height: 5\' 8"  (1.727 m)  Weight: 149 lb (67.586 kg)   Body mass index is 22.66 kg/(m^2). General: The patient is alert and cooperative at the time of the examination. Skin: No significant peripheral edema is noted.  Neurologic Exam Mental status: The patient is oriented x 3. Follows all commands Cranial nerves: Facial symmetry is  present. Speech is normal, no aphasia or dysarthria is noted. PERL.Extraocular movements are full. Visual fields are full. Motor: The patient has good strength in all 4 extremities. Sensory examination: Soft touch sensation on the face, arms, and legs is symmetric. Coordination: The patient has good finger-nose-finger and heel-to-shin bilaterally. Gait and station: The patient has a normal gait. Tandem gait is normal. Romberg is negative. No drift is seen. Reflexes: Deep tendon reflexes are symmetric.   DIAGNOSTIC DATA (LABS, IMAGING, TESTING) -   ASSESSMENT AND PLAN  60 y.o. year old female  has a past medical history of Arthritis; migraine (08/01/2014); here to follow up. Her migraine triggers are weather changes, lack of sleep and stress. She is currently on  Topamax 50 twice daily and Maxalt acutely. She is not interested in changing her medications   PLAN: Continue Topamax at current dose will refill Continue Maxalt acutely will refill F/U in 6 months [name], [facility], [facility], APRN  [facility] [address] [place], [place] [address] [phone]

## 2015-09-12 NOTE — Progress Notes (Signed)
I have read the note, and I agree with the clinical assessment and plan.  Patryk Conant KEITH   

## 2015-09-12 NOTE — Patient Instructions (Signed)
Continue Topamax at current dose will refill Continue Maxalt acutely will refill F/U in 6 months

## 2015-11-07 ENCOUNTER — Ambulatory Visit (HOSPITAL_COMMUNITY)
Admission: RE | Admit: 2015-11-07 | Discharge: 2015-11-07 | Disposition: A | Discharge status: Home / Self Care | Payer: BLUE CROSS/BLUE SHIELD | Source: Ambulatory Visit | Attending: Endocrinology | Admitting: Endocrinology

## 2015-11-07 DIAGNOSIS — E041 Nontoxic single thyroid nodule: Secondary | ICD-10-CM | POA: Diagnosis present

## 2015-11-07 DIAGNOSIS — E042 Nontoxic multinodular goiter: Secondary | ICD-10-CM | POA: Insufficient documentation

## 2015-11-13 ENCOUNTER — Other Ambulatory Visit: Payer: Self-pay | Admitting: Nurse Practitioner

## 2016-03-12 ENCOUNTER — Encounter: Payer: Self-pay | Admitting: Nurse Practitioner

## 2016-03-12 ENCOUNTER — Ambulatory Visit (INDEPENDENT_AMBULATORY_CARE_PROVIDER_SITE_OTHER): Payer: BLUE CROSS/BLUE SHIELD | Admitting: Nurse Practitioner

## 2016-03-12 VITALS — BP 99/69 | HR 60 | Ht 68.0 in | Wt 143.6 lb

## 2016-03-12 DIAGNOSIS — G43019 Migraine without aura, intractable, without status migrainosus: Secondary | ICD-10-CM | POA: Diagnosis not present

## 2016-03-12 MED ORDER — RIZATRIPTAN BENZOATE 10 MG PO TBDP
10.0000 mg | ORAL_TABLET | ORAL | Status: DC | PRN
Start: 2016-03-12 — End: 2017-03-11

## 2016-03-12 MED ORDER — TOPIRAMATE 50 MG PO TABS: 50.0000 mg | ORAL_TABLET | Freq: Two times a day (BID) | ORAL | Status: DC

## 2016-03-12 NOTE — Progress Notes (Signed)
[facility]  PATIENT: GENESE HIBBETT DOB: 10-Apr-1956   REASON FOR VISIT: Follow-up for migraine HISTORY FROM: Patient    HISTORY OF PRESENT ILLNESS:Ms. Fasick is a 60 year old right-handed white female with migraine headache returns for follow-up. She was last seen in this office 09/12/15. The patient has increased headaches with weather changes, 1-2 per week .She is currently taking Maxalt acutely which works. 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 She indicates that the headaches rarely prevent her from doing her work. She is currently taking Topamax 50mg  BID. The patient denies any new medical issues that have come up since last seen. The patient returns for an evaluation   REVIEW OF SYSTEMS: Full 14 system review of systems performed and notable only for those listed, all others are neg:  Constitutional: neg  Cardiovascular: neg Ear/Nose/Throat: neg  Skin: neg Eyes: neg Respiratory: neg Gastroitestinal: neg  Hematology/Lymphatic: neg  Endocrine: neg Musculoskeletal:neg Allergy/Immunology: neg Neurological: neg Psychiatric: neg Sleep : neg   ALLERGIES: No Known Allergies  HOME MEDICATIONS: Outpatient Prescriptions Prior to Visit  Medication Sig Dispense Refill  . alendronate (FOSAMAX) 70 MG tablet Take 70 mg by mouth every 7 (seven) days. Take with a full glass of water on an empty stomach.    . Calcium-Vitamin D-Vitamin K (VIACTIV PO) Take 3 tablets by mouth daily.    . pantoprazole (PROTONIX) 40 MG tablet Take 1 tablet (40 mg total) by mouth daily. 90 tablet 3  . rizatriptan (MAXALT-MLT) 10 MG disintegrating tablet Take 1 tablet (10 mg total) by mouth as needed for migraine. May repeat in 2 hours if needed 27 tablet 3  . topiramate (TOPAMAX) 50 MG tablet Take 1 tablet (50 mg total) by mouth 2 (two) times daily. 180 tablet 2   No facility-administered medications prior to visit.    PAST MEDICAL  HISTORY: Past Medical History  Diagnosis Date  . Arthritis   . GERD (gastroesophageal reflux disease)   . Osteoporosis   . Common migraine 08/01/2014  . History of hepatitis A   . Diverticulosis   . Thyroid nodule     per recent US, has enlarged, but being monitored now    PAST SURGICAL HISTORY: Past Surgical History  Procedure Laterality Date  . Vaginal hysterectomy    . Back surgery  12/2006    Lumbosacral spine  . Thyroid lumpectomy  1981  . Tonsillectomy    . Nasal sinus surgery    . Upper gastrointestinal endoscopy    . Colonoscopy      FAMILY HISTORY: Family History  Problem Relation Age of Onset  . Stomach cancer Maternal Grandmother   . Colon cancer Neg Hx   . Esophageal cancer Neg Hx   . Rectal cancer Neg Hx   . Diabetes Mother   . COPD Mother   . Hypertension Mother   . Heart attack Father   . Migraines Father   . Migraines Sister   . Other      brain tumor    SOCIAL HISTORY: Social History   Social History  . Marital Status: Married    Spouse Name: N/A  . Number of Children: 1  . Years of Education: N/A   Occupational History  . retired    Social History Main Topics  . Smoking status: Former Smoker    Quit date: 08/25/1978  . Smokeless tobacco: Never Used  . Alcohol Use: 1.5 oz/week    3 Standard drinks or equivalent  per week     Comment: occ  . Drug Use: No  . Sexual Activity: Not on file   Other Topics Concern  . Not on file   Social History Narrative     PHYSICAL EXAM  Filed Vitals:   03/12/16 1257  BP: 99/69  Pulse: 60  Height: 5\' 8"  (1.727 m)  Weight: 143 lb 9.6 oz (65.137 kg)   Body mass index is 21.84 kg/(m^2). General: The patient is alert and cooperative at the time of the examination. Skin: No significant peripheral edema is noted.  Neurologic Exam Mental status: The patient is oriented x 3. Follows all commands Cranial nerves: Facial symmetry is present. Speech is normal, no aphasia or dysarthria is noted.  PERL.Extraocular movements are full. Visual fields are full. Motor: The patient has good strength in all 4 extremities. Sensory examination: Soft touch sensation on the face, arms, and legs is symmetric. Coordination: The patient has good finger-nose-finger and heel-to-shin bilaterally. Gait and station: The patient has a normal gait. Tandem gait is normal. Romberg is negative. No drift is seen. Reflexes: Deep tendon reflexes are symmetric.  DIAGNOSTIC DATA (LABS, IMAGING, TESTING) -  ASSESSMENT AND PLAN 60 y.o. year old female has a past medical history of Arthritis; migraine (08/01/2014); here to follow up for migraine. Her migraine triggers are weather changes, lack of sleep and stress. She is currently on Topamax 50 twice daily and Maxalt acutely. She is not interested in increasing or changing her medications   PLAN: Continue Topamax at current dose will refill Continue Maxalt acutely will refill Follow up yearly Call for increase in headaches Dennie Bible, [facility], [facility], [facility] [address] [place], [place] [address] [phone]

## 2016-03-12 NOTE — Progress Notes (Signed)
I have read the note, and I agree with the clinical assessment and plan.  Micki Cassel KEITH   

## 2016-03-12 NOTE — Patient Instructions (Signed)
   Continue Topamax at current dose will refill Continue Maxalt acutely will refill Follow up yearly Call for increase in headaches

## 2016-03-16 ENCOUNTER — Other Ambulatory Visit: Payer: Self-pay | Admitting: Gastroenterology

## 2017-03-11 ENCOUNTER — Encounter: Payer: Self-pay | Admitting: Nurse Practitioner

## 2017-03-11 ENCOUNTER — Ambulatory Visit (INDEPENDENT_AMBULATORY_CARE_PROVIDER_SITE_OTHER): Payer: BLUE CROSS/BLUE SHIELD | Admitting: Nurse Practitioner

## 2017-03-11 VITALS — BP 113/78 | HR 63 | Wt 144.2 lb

## 2017-03-11 DIAGNOSIS — G43009 Migraine without aura, not intractable, without status migrainosus: Secondary | ICD-10-CM

## 2017-03-11 MED ORDER — TOPIRAMATE 50 MG PO TABS: 50.0000 mg | ORAL_TABLET | Freq: Two times a day (BID) | ORAL | 3 refills | Status: DC

## 2017-03-11 MED ORDER — RIZATRIPTAN BENZOATE 10 MG PO TBDP: 10.0000 mg | ORAL_TABLET | ORAL | 3 refills | Status: DC | PRN

## 2017-03-11 NOTE — Progress Notes (Signed)
I have read the note, and I agree with the clinical assessment and plan.  [name] KEITH   

## 2017-03-11 NOTE — Patient Instructions (Addendum)
Continue Topamax at current dose will refill Continue Maxalt acutely will refill Follow up yearly Call for increase in headaches Stay well hydrated  Dehydration, Adult Dehydration is when there is not enough fluid or water in your body. This happens when you lose more fluids than you take in. Dehydration can range from mild to very bad. It should be treated right away to keep it from getting very bad. Symptoms of mild dehydration may include:  Thirst.  Dry lips.  Slightly dry mouth.  Dry, warm skin.  Dizziness. Symptoms of moderate dehydration may include:  Very dry mouth.  Muscle cramps.  Dark pee (urine). Pee may be the color of tea.  Your body making less pee.  Your eyes making fewer tears.  Heartbeat that is uneven or faster than normal (palpitations).  Headache.  Light-headedness, especially when you stand up from sitting.  Fainting (syncope). Symptoms of very bad dehydration may include:  Changes in skin, such as: ? Cold and clammy skin. ? Blotchy (mottled) or pale skin. ? Skin that does not quickly return to normal after being lightly pinched and let go (poor skin turgor).  Changes in body fluids, such as: ? Feeling very thirsty. ? Your eyes making fewer tears. ? Not sweating when body temperature is high, such as in hot weather. ? Your body making very little pee.  Changes in vital signs, such as: ? Weak pulse. ? Pulse that is more than 100 beats a minute when you are sitting still. ? Fast breathing. ? Low blood pressure.  Other changes, such as: ? Sunken eyes. ? Cold hands and feet. ? Confusion. ? Lack of energy (lethargy). ? Trouble waking up from sleep. ? Short-term weight loss. ? Unconsciousness. Follow these instructions at home:  If told by your doctor, drink an ORS: ? Make an ORS by using instructions on the package. ? Start by drinking small amounts, about  cup (120 mL) every 5-10 minutes. ? Slowly drink more until you have had  the amount that your doctor said to have.  Drink enough clear fluid to keep your pee clear or pale yellow. If you were told to drink an ORS, finish the ORS first, then start slowly drinking clear fluids. Drink fluids such as: ? Water. Do not drink only water by itself. Doing that can make the salt (sodium) level in your body get too low (hyponatremia). ? Ice chips. ? Fruit juice that you have added water to (diluted). ? Low-calorie sports drinks.  Avoid: ? Alcohol. ? Drinks that have a lot of sugar. These include high-calorie sports drinks, fruit juice that does not have water added, and soda. ? Caffeine. ? Foods that are greasy or have a lot of fat or sugar.  Take over-the-counter and prescription medicines only as told by your doctor.  Do not take salt tablets. Doing that can make the salt level in your body get too high (hypernatremia).  Eat foods that have minerals (electrolytes). Examples include bananas, oranges, potatoes, tomatoes, and spinach.  Keep all follow-up visits as told by your doctor. This is important. Contact a doctor if:  You have belly (abdominal) pain that: ? Gets worse. ? Stays in one area (localizes).  You have a rash.  You have a stiff neck.  You get angry or annoyed more easily than normal (irritability).  You are more sleepy than normal.  You have a harder time waking up than normal.  You feel: ? Weak. ? Dizzy. ? Very thirsty.  You  have peed (urinated) only a small amount of very dark pee during 6-8 hours. Get help right away if:  You have symptoms of very bad dehydration.  You cannot drink fluids without throwing up (vomiting).  Your symptoms get worse with treatment.  You have a fever.  You have a very bad headache.  You are throwing up or having watery poop (diarrhea) and it: ? Gets worse. ? Does not go away.  You have blood or something green (bile) in your throw-up.  You have blood in your poop (stool). This may cause poop  to look black and tarry.  You have not peed in 6-8 hours.  You pass out (faint).  Your heart rate when you are sitting still is more than 100 beats a minute.  You have trouble breathing. This information is not intended to replace advice given to you by your health care provider. Make sure you discuss any questions you have with your health care provider. Document Released: 06/07/2009 Document Revised: 02/29/2016 Document Reviewed: 10/05/2015 Elsevier Interactive Patient Education  2018 [employer].

## 2017-03-11 NOTE — Progress Notes (Signed)
[facility]  PATIENT: [name] DOB: 06/15/1956   REASON FOR VISIT: Follow-up for migraine HISTORY FROM: Patient    HISTORY OF PRESENT ILLNESS:[name] is a 62 year old right-handed white female with migraine headache returns for yearly follow-up. 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 Her headaches usually start at the end of the day. She does not drink much water . She is currently taking Maxalt acutely which works for her migraines.  She indicates that the headaches rarely prevent her from doing her work. She is currently taking Topamax 50mg  BID. The patient denies any new medical issues that have come up since last seen. The patient returns for an evaluation   REVIEW OF SYSTEMS: Full 14 system review of systems performed and notable only for those listed, all others are neg:  Constitutional: neg  Cardiovascular: neg Ear/Nose/Throat: neg  Skin: neg Eyes: neg Respiratory: neg Gastroitestinal: neg  Hematology/Lymphatic: neg  Endocrine: Intolerance to heat Musculoskeletal:neg Allergy/Immunology: neg Neurological: headache Psychiatric: neg Sleep : neg   ALLERGIES: No Known Allergies  HOME MEDICATIONS: Outpatient Medications Prior to Visit  Medication Sig Dispense Refill  . Calcium-Vitamin D-Vitamin K (VIACTIV PO) Take 3 tablets by mouth daily.    . pantoprazole (PROTONIX) 40 MG tablet Take 1 tablet (40 mg total) by mouth daily. 90 tablet 3  . rizatriptan (MAXALT-MLT) 10 MG disintegrating tablet Take 1 tablet (10 mg total) by mouth as needed for migraine. May repeat in 2 hours if needed 27 tablet 3  . topiramate (TOPAMAX) 50 MG tablet Take 1 tablet (50 mg total) by mouth 2 (two) times daily. 180 tablet 3  . alendronate (FOSAMAX) 70 MG tablet Take 70 mg by mouth every 7 (seven) days. Take with a  full glass of water on an empty stomach.    . escitalopram (LEXAPRO) 10 MG tablet Take 10 mg by mouth daily.      No facility-administered medications prior to visit.     PAST MEDICAL HISTORY: Past Medical History:  Diagnosis Date  . Arthritis   . Common migraine 08/01/2014  . Diverticulosis   . GERD (gastroesophageal reflux disease)   . History of hepatitis A   . Osteoporosis   . Thyroid nodule    per recent US, has enlarged, but being monitored now    PAST SURGICAL HISTORY: Past Surgical History:  Procedure Laterality Date  . BACK SURGERY  12/2006   Lumbosacral spine  . COLONOSCOPY    . NASAL SINUS SURGERY    . thyroid lumpectomy  1981  . TONSILLECTOMY    . UPPER GASTROINTESTINAL ENDOSCOPY    . VAGINAL HYSTERECTOMY      FAMILY HISTORY: Family History  Problem Relation Age of Onset  . Diabetes Mother   . COPD Mother   . Hypertension Mother   . Heart attack Father   . Migraines Father   . Migraines Sister   . Stomach cancer Maternal Grandmother   . Other Unknown        brain tumor  . Colon cancer Neg Hx   . Esophageal cancer Neg Hx   . Rectal cancer Neg Hx     SOCIAL HISTORY: Social History   Social History  . Marital status: Married    Spouse name: N/A  . Number of children: 1  . Years of education: N/A   Occupational  History  . retired    Social History Main Topics  . Smoking status: Former Smoker    Quit date: 08/25/1978  . Smokeless tobacco: Never Used  . Alcohol use 1.5 oz/week    3 Standard drinks or equivalent per week     Comment: occ  . Drug use: No  . Sexual activity: Not on file   Other Topics Concern  . Not on file   Social History Narrative  . No narrative on file     PHYSICAL EXAM  Vitals:   03/11/17 1240  BP: 113/78  Pulse: 63  Weight: 144 lb 3.2 oz (65.4 kg)   Body mass index is 21.93 kg/m. General: The patient is alert and cooperative at the time of the examination. Skin: No significant peripheral edema is  noted.  Neurologic Exam Mental status: The patient is oriented x 3. Follows all commands Cranial nerves: Facial symmetry is present. Speech is normal, no aphasia or dysarthria is noted. PERL.Extraocular movements are full. Visual fields are full. Motor: The patient has good strength in all 4 extremities. Sensory examination: Soft touch sensation on the face, arms, and legs is symmetric. Coordination: The patient has good finger-nose-finger and heel-to-shin bilaterally. Gait and station: The patient has a normal gait. Tandem gait is normal. Romberg is negative. No drift is seen. Reflexes: Deep tendon reflexes are symmetric.  DIAGNOSTIC DATA (LABS, IMAGING, TESTING) -  ASSESSMENT AND PLAN 61 y.o. year old female has a past medical history of Arthritis; migraine (08/01/2014); here to follow up for migraine. Her migraine triggers are weather changes, heat, lack of sleep and stress. She is currently on Topamax 50 twice daily and Maxalt acutely. She is not interested in increasing or changing her medications   PLAN: Continue Topamax at current dose will refill Continue Maxalt acutely will refill Follow up yearly Call for increase in headaches Patient encouraged to stay well hydrated to prevent dehydration Dennie Bible, [facility], [facility], APRN  [facility] [address] [place], [place] [address] [phone]

## 2017-03-25 IMAGING — US US SOFT TISSUE HEAD/NECK
1 series · 14 of 25 positions shown · non-contrast
Comparison: 05/12/2014

CLINICAL DATA: Thyroid nodule

EXAM:
THYROID ULTRASOUND
TECHNIQUE: Ultrasound examination of the thyroid gland and adjacent soft
tissues was performed.

[Series 1: us soft tissue head/neck · 0.04mm/px · 14 of 84 slices shown]
[im 1/84]
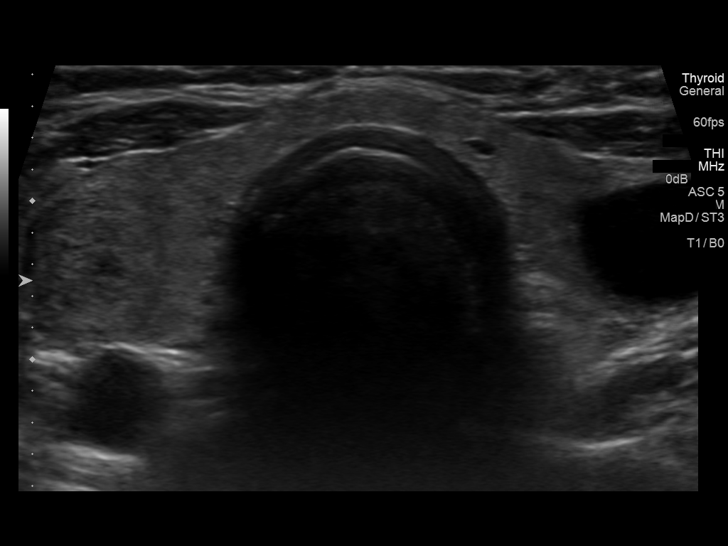
[im 7/84]
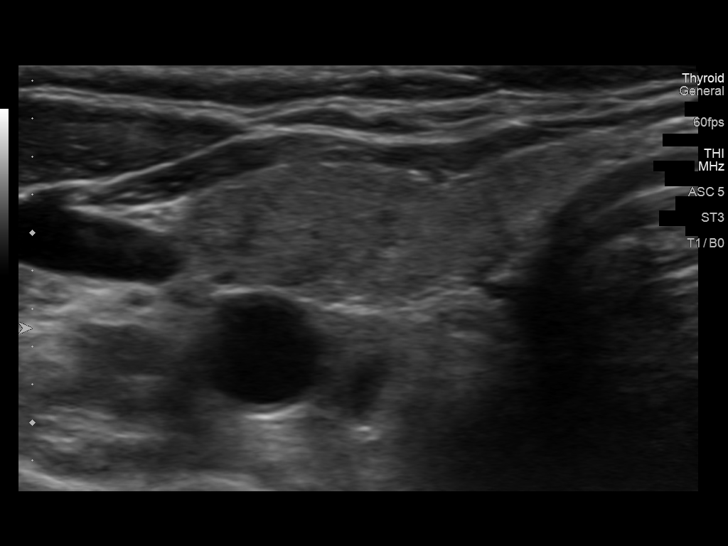
[im 14/84]
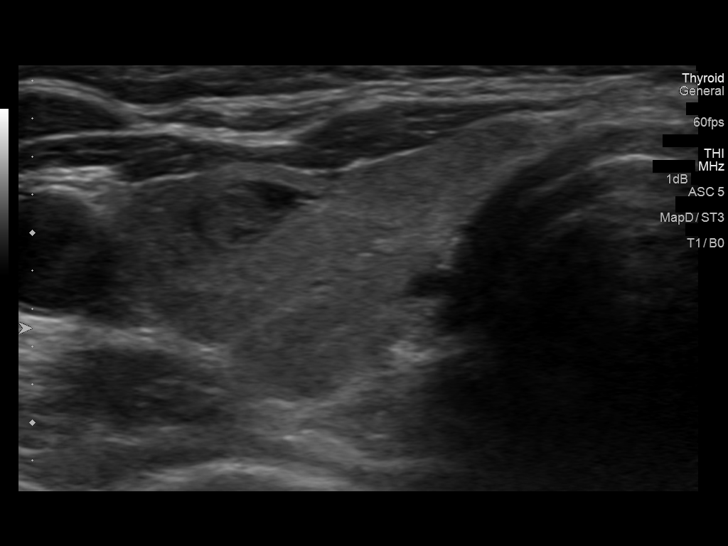
[im 21/84]
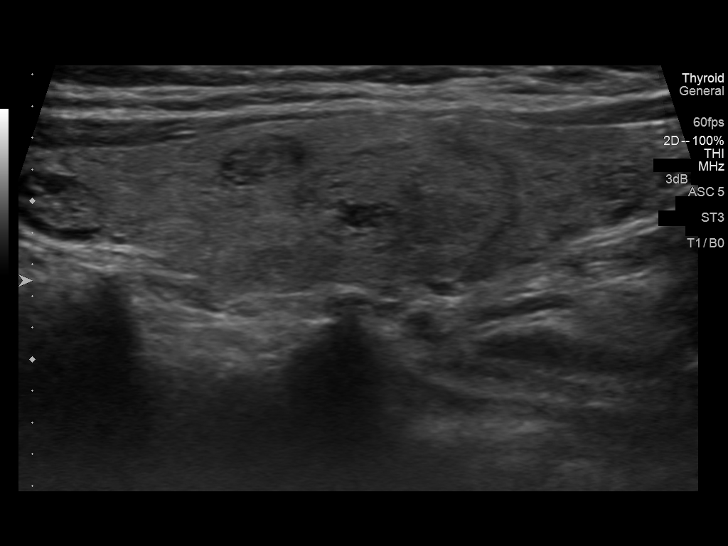
[im 28/84]
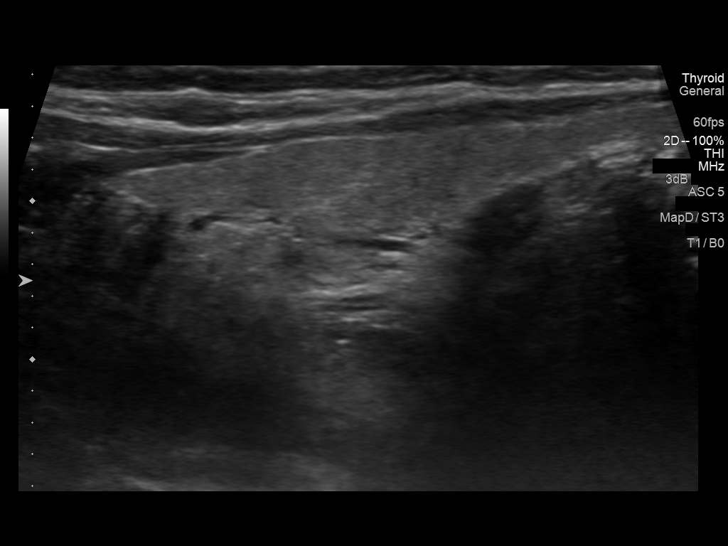
[im 32/84]
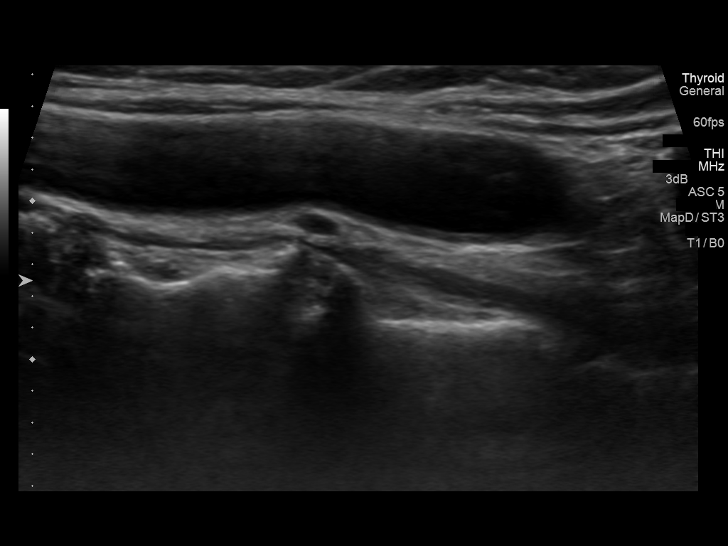
[im 39/84]
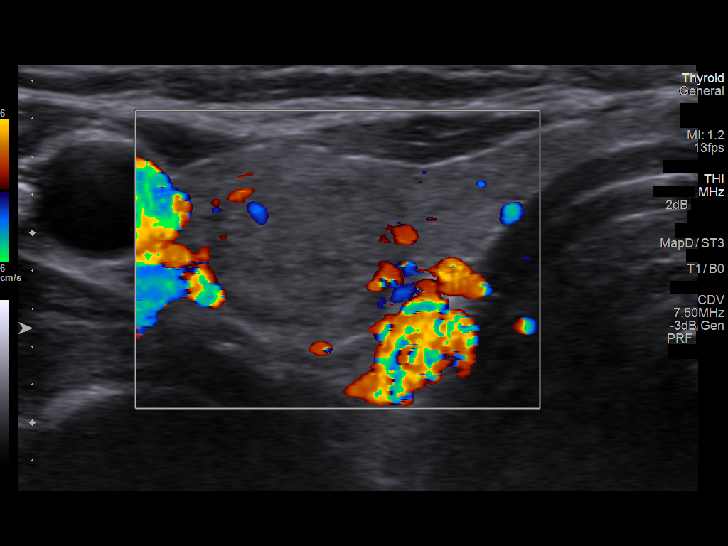
[im 45/84]
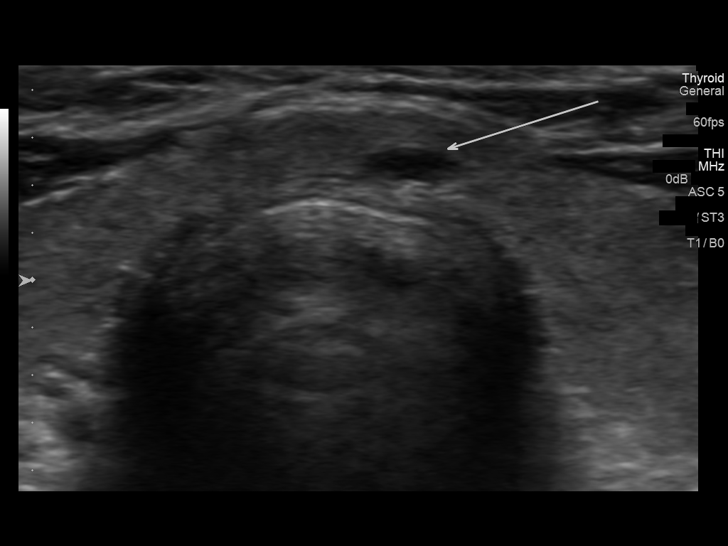
[im 52/84]
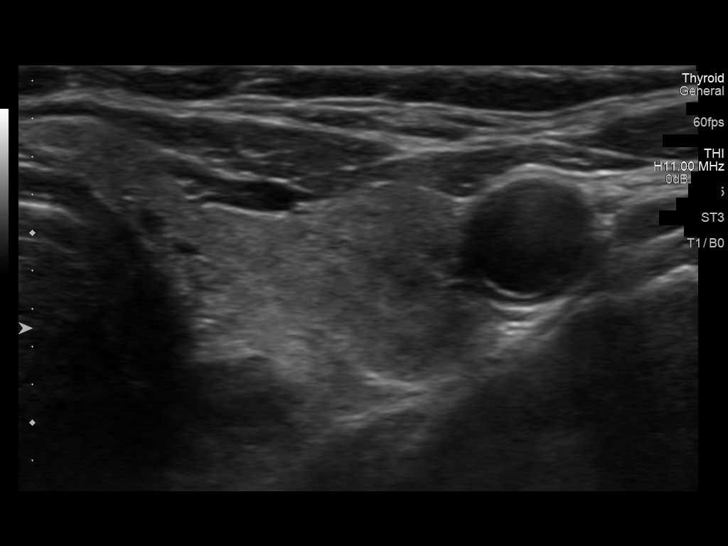
[im 56/84]
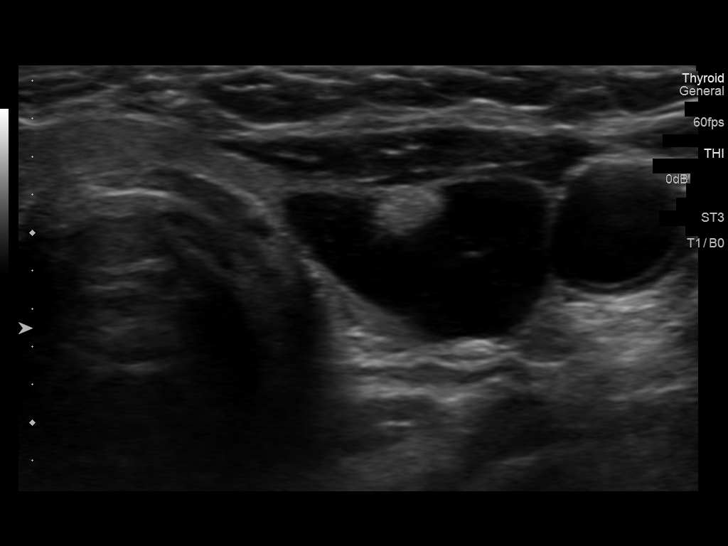
[im 63/84]
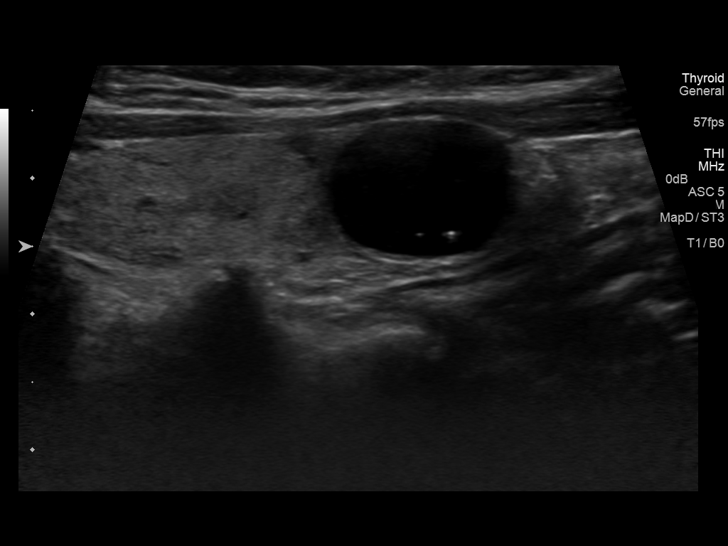
[im 70/84]
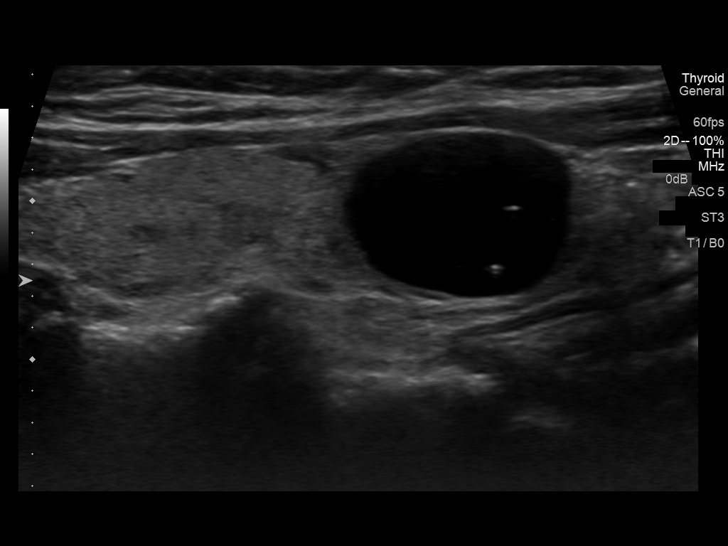
[im 77/84]
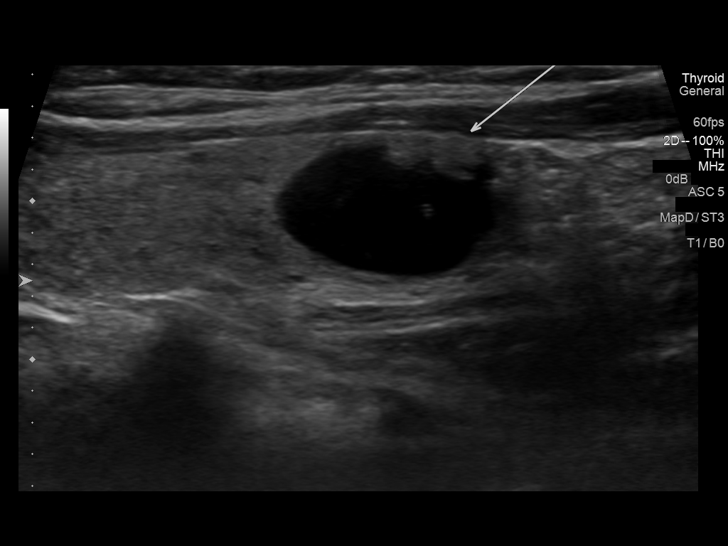
[im 84/84]
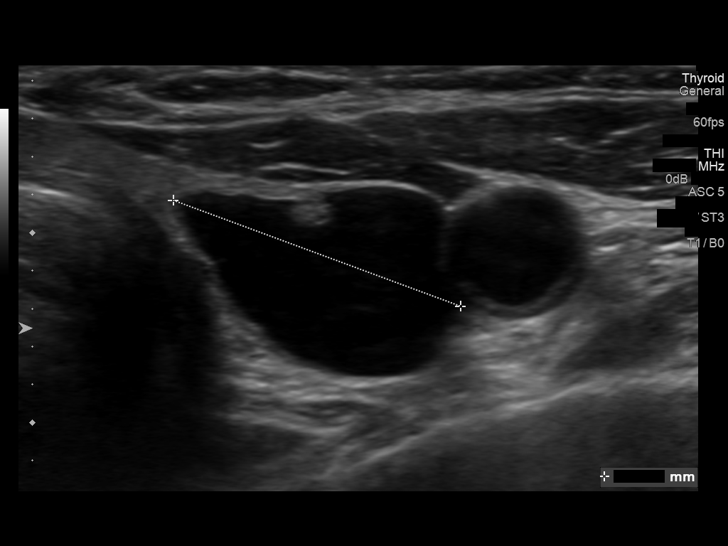

[14 of 25 positions shown; findings below may reference images not displayed]

FINDINGS: Right thyroid lobe

Measurements: 4.9 x 1.2 x 1.8 cm. Solid mid lobe nodule measures
x 0.9 x 1.1 cm and previously measured 1.3 x 0.9 x 1.1 cm. 7 mm
upper pole nodule.

Left thyroid lobe

Measurements: 4.8 x 1.2 x 1.6 cm. 1.7 x 1.1 x 1.6 cm complex mid
lobe nodule previously measured 1.3 x 1.1 x 1.1 cm

Isthmus

Thickness: 3 mm.  No nodules visualized.

Lymphadenopathy

None visualized.
IMPRESSION: Bilateral thyroid nodules are again noted. The predominately cystic
left mid lobe nodule is dominant and has enlarged. This is of
unknown significance.

## 2017-04-16 ENCOUNTER — Other Ambulatory Visit: Payer: Self-pay | Admitting: Nurse Practitioner

## 2017-04-29 ENCOUNTER — Other Ambulatory Visit: Payer: Self-pay | Admitting: Nurse Practitioner

## 2017-07-21 ENCOUNTER — Other Ambulatory Visit: Payer: Self-pay | Admitting: Dermatology

## 2018-03-15 NOTE — Progress Notes (Signed)
[facility]  PATIENT: [name] DOB: 03/30/1956   REASON FOR VISIT: Follow-up for migraine HISTORY FROM: Patient    HISTORY OF PRESENT ILLNESS:Ms. [name] is a 62 year old right-handed white female with migraine headache returns for yearly follow-up. The patient has increased headaches with weather changes, 1-4 per month. Her headaches usually start at the end of the day. She is currently taking Maxalt acutely which works for her migraines.  She indicates that the headaches rarely prevent her from doing her work. She is currently taking Topamax 50mg  BID. The patient denies any new medical issues that have come up since last seen.  He continues to have periodic hot flashes.  The patient returns for an evaluation   REVIEW OF SYSTEMS: Full 14 system review of systems performed and notable only for those listed, all others are neg:  Constitutional: neg  Cardiovascular: neg Ear/Nose/Throat: neg  Skin: neg Eyes: neg Respiratory: neg Gastroitestinal: neg  Hematology/Lymphatic: neg  Endocrine: Intolerance to heat Musculoskeletal:neg Allergy/Immunology: neg Neurological: headache Psychiatric: neg Sleep : neg   ALLERGIES: No Known Allergies  HOME MEDICATIONS: Outpatient Medications Prior to Visit  Medication Sig Dispense Refill  . alendronate (FOSAMAX) 70 MG tablet 70 mg every 7 (seven) days.    . Calcium-Vitamin D-Vitamin K (VIACTIV PO) Take 3 tablets by mouth daily.    Marland Kitchen doxycycline (VIBRAMYCIN) 100 MG capsule TAKE 1 CAPSULE BY MOUTH TWICE A DAY FOR 10 DAYS  0  . escitalopram (LEXAPRO) 10 MG tablet 5 mg daily.  1  . pantoprazole (PROTONIX) 40 MG tablet Take 1 tablet (40 mg total) by mouth daily. 90 tablet 3  . rizatriptan (MAXALT-MLT) 10 MG disintegrating tablet Take 1 tablet (10 mg total) by mouth as needed for migraine. May repeat in 2 hours if needed 27 tablet 3  . topiramate (TOPAMAX) 50 MG tablet Take 1 tablet (50 mg total) by mouth 2 (two) times  daily. 180 tablet 3  . zolpidem (AMBIEN) 10 MG tablet 5 mg at bedtime.     No facility-administered medications prior to visit.     PAST MEDICAL HISTORY: Past Medical History:  Diagnosis Date  . Arthritis   . Common migraine 08/01/2014  . Diverticulosis   . GERD (gastroesophageal reflux disease)   . History of hepatitis A   . Osteoporosis   . Thyroid nodule    per recent US, has enlarged, but being monitored now    PAST SURGICAL HISTORY: Past Surgical History:  Procedure Laterality Date  . BACK SURGERY  12/2006   Lumbosacral spine  . COLONOSCOPY    . NASAL SINUS SURGERY    . thyroid lumpectomy  1981  . TONSILLECTOMY    . UPPER GASTROINTESTINAL ENDOSCOPY    . VAGINAL HYSTERECTOMY      FAMILY HISTORY: Family History  Problem Relation Age of Onset  . Diabetes Mother   . COPD Mother   . Hypertension Mother   . Heart attack Father   . Migraines Father   . Migraines Sister   . Stomach cancer Maternal Grandmother   . Other Unknown        brain tumor  . Colon cancer Neg Hx   . Esophageal cancer Neg Hx   . Rectal cancer Neg Hx     SOCIAL HISTORY: Social History   Socioeconomic History  . Marital status: Married    Spouse name: Not on file  . Number of children: 1  . Years of education: Not on file  . Highest  education level: Not on file  Occupational History  . Occupation: retired  Scientific laboratory technician  . Financial resource strain: Not on file  . Food insecurity:    Worry: Not on file    Inability: Not on file  . Transportation needs:    Medical: Not on file    Non-medical: Not on file  Tobacco Use  . Smoking status: Former Smoker    Last attempt to quit: 08/25/1978    Years since quitting: 39.5  . Smokeless tobacco: Never Used  Substance and Sexual Activity  . Alcohol use: Yes    Alcohol/week: 1.8 oz    Types: 3 Standard drinks or equivalent per week    Comment: occ  . Drug use: No  . Sexual activity: Not on file  Lifestyle  . Physical activity:    Days  per week: Not on file    Minutes per session: Not on file  . Stress: Not on file  Relationships  . Social connections:    Talks on phone: Not on file    Gets together: Not on file    Attends religious service: Not on file    Active member of club or organization: Not on file    Attends meetings of clubs or organizations: Not on file    Relationship status: Not on file  . Intimate partner violence:    Fear of current or ex partner: Not on file    Emotionally abused: Not on file    Physically abused: Not on file    Forced sexual activity: Not on file  Other Topics Concern  . Not on file  Social History Narrative  . Not on file     PHYSICAL EXAM  Vitals:   03/17/18 1229  BP: 115/78  Pulse: (!) 56  Weight: 137 lb 12.8 oz (62.5 kg)  Height: 5\' 7"  (1.702 m)   Body mass index is 21.58 kg/m. General: The patient is alert and cooperative at the time of the examination. Skin: No significant peripheral edema is noted.  Neurologic Exam Mental status: The patient is oriented x 3. Follows all commands Cranial nerves: Facial symmetry is present. Speech is normal, no aphasia or dysarthria is noted. PERL.Extraocular movements are full. Visual fields are full. Motor: The patient has good strength in all 4 extremities. Sensory examination: Soft touch sensation on the face, arms, and legs is symmetric. Coordination: The patient has good finger-nose-finger and heel-to-shin bilaterally. Gait and station: The patient has a normal gait. Tandem gait is normal. Romberg is negative. No drift is seen. Reflexes: Deep tendon reflexes are symmetric.  DIAGNOSTIC DATA (LABS, IMAGING, TESTING) -  ASSESSMENT AND PLAN 62 y.o. year old female has a past medical history of Arthritis; migraine (08/01/2014); here to follow up for migraine. Her migraine triggers are weather changes, heat, lack of sleep and stress. She is currently on Topamax 50 twice daily and Maxalt acutely. She is not interested in  increasing or changing her medications.  She has 1-4 headaches per month.   PLAN: Continue Topamax at current dose will refill Continue Maxalt acutely will refill Follow up yearly Call for increase in headaches Patient encouraged to stay well hydrated to prevent dehydration Dennie Bible, [facility], [facility], APRN  [facility] [address] [place], [place] [address] [phone]

## 2018-03-17 ENCOUNTER — Encounter: Payer: Self-pay | Admitting: Nurse Practitioner

## 2018-03-17 ENCOUNTER — Ambulatory Visit (INDEPENDENT_AMBULATORY_CARE_PROVIDER_SITE_OTHER): Payer: BLUE CROSS/BLUE SHIELD | Admitting: Nurse Practitioner

## 2018-03-17 VITALS — BP 115/78 | HR 56 | Ht 67.0 in | Wt 137.8 lb

## 2018-03-17 DIAGNOSIS — G43009 Migraine without aura, not intractable, without status migrainosus: Secondary | ICD-10-CM

## 2018-03-17 MED ORDER — RIZATRIPTAN BENZOATE 10 MG PO TBDP: 10.0000 mg | ORAL_TABLET | ORAL | 3 refills | Status: DC | PRN

## 2018-03-17 MED ORDER — TOPIRAMATE 50 MG PO TABS: 50.0000 mg | ORAL_TABLET | Freq: Two times a day (BID) | ORAL | 3 refills | Status: DC

## 2018-03-17 NOTE — Progress Notes (Signed)
I have read the note, and I agree with the clinical assessment and plan.  Charles K Willis   

## 2018-03-17 NOTE — Patient Instructions (Signed)
Continue Topamax at current dose will refill Continue Maxalt acutely will refill Follow up yearly

## 2018-04-02 ENCOUNTER — Other Ambulatory Visit (HOSPITAL_COMMUNITY): Payer: Self-pay | Admitting: Endocrinology

## 2018-04-02 DIAGNOSIS — E041 Nontoxic single thyroid nodule: Secondary | ICD-10-CM

## 2018-04-19 ENCOUNTER — Ambulatory Visit (HOSPITAL_COMMUNITY)
Admission: RE | Admit: 2018-04-19 | Discharge: 2018-04-19 | Disposition: A | Discharge status: Home / Self Care | Payer: BLUE CROSS/BLUE SHIELD | Source: Ambulatory Visit | Attending: Endocrinology | Admitting: Endocrinology

## 2018-04-19 DIAGNOSIS — E041 Nontoxic single thyroid nodule: Secondary | ICD-10-CM

## 2018-04-19 DIAGNOSIS — E042 Nontoxic multinodular goiter: Secondary | ICD-10-CM | POA: Insufficient documentation

## 2018-09-16 ENCOUNTER — Encounter: Payer: Self-pay | Admitting: Nurse Practitioner

## 2019-02-22 ENCOUNTER — Other Ambulatory Visit: Payer: Self-pay | Admitting: *Deleted

## 2019-02-22 MED ORDER — RIZATRIPTAN BENZOATE 10 MG PO TBDP: 10.0000 mg | ORAL_TABLET | ORAL | 3 refills | Status: DC | PRN

## 2019-02-22 NOTE — Telephone Encounter (Signed)
Pt has appt 03-23-19 with SS/NP.  Has been compliant in coming to appts every yr or sooner if needed.

## 2019-03-22 NOTE — Progress Notes (Signed)
PATIENT: [name] DOB: 02/24/1956  REASON FOR VISIT: follow up HISTORY FROM: patient  HISTORY OF PRESENT ILLNESS: Today 03/23/19  [name] is a 62 year old female with history of migraine headaches.  She is currently taking Topamax 50 mg twice a day, Maxalt as needed.  She says her migraines are under good control.  She says she may have on average 4 migraines a month.  When she gets a headache she will take 2 Advil, in combination with Maxalt and the headache will abate.  She does feel that her memory may not be as sharp, she cannot remember names as quickly, and she feels forgetful.  She wonders if this may be a side effect of the Topamax.  She indicates her overall health has been good.  She sees her gynecologist on an annual basis.  She follows with endocrinology yearly for a cyst on her thyroid.  She presents today for follow-up unaccompanied.  She is retired and lives with her husband.  They are currently renovating a [place].  HISTORY 03/17/2018 CM: [name] is a 63 year old right-handed white female with migraine headache returns for yearly follow-up. The patient has increased headaches with weather changes, 1-4 per month. Her headaches usually start at the end of the day. She is currently taking Maxalt acutely which works for her migraines.  She indicates that the headaches rarely prevent her from doing her work. She is currently taking Topamax 45m BID. The patient denies any new medical issues that have come up since last seen.  He continues to have periodic hot flashes.  The patient returns for an evaluation  REVIEW OF SYSTEMS: Out of a complete 14 system review of symptoms, the patient complains only of the following symptoms, and all other reviewed systems are negative.  Headache  ALLERGIES: No Known Allergies  HOME MEDICATIONS: Outpatient Medications Prior to Visit  Medication Sig Dispense Refill  . alendronate (FOSAMAX) 70 MG tablet 70 mg every 7 (seven)  days.    . pantoprazole (PROTONIX) 40 MG tablet Take 1 tablet (40 mg total) by mouth daily. 90 tablet 3  . rizatriptan (MAXALT-MLT) 10 MG disintegrating tablet Take 1 tablet (10 mg total) by mouth as needed for migraine. May repeat in 2 hours if needed 27 tablet 3  . topiramate (TOPAMAX) 50 MG tablet Take 1 tablet (50 mg total) by mouth 2 (two) times daily. 180 tablet 3  . Calcium-Vitamin D-Vitamin K (VIACTIV PO) Take 3 tablets by mouth daily.    .Marland Kitchendoxycycline (VIBRAMYCIN) 100 MG capsule TAKE 1 CAPSULE BY MOUTH TWICE A DAY FOR 10 DAYS  0  . escitalopram (LEXAPRO) 10 MG tablet 5 mg daily.  1  . zolpidem (AMBIEN) 10 MG tablet 5 mg at bedtime.     No facility-administered medications prior to visit.     PAST MEDICAL HISTORY: Past Medical History:  Diagnosis Date  . Arthritis   . BCC (basal cell carcinoma of skin) 11/22/1993   bcc left nasal bridge tx: curet, exc.  .Marland KitchenBCC (basal cell carcinoma of skin) 12/20/1993   bcc + margin left nasal bridge tx:exc.  . Common migraine 08/01/2014  . Diverticulosis   . GERD (gastroesophageal reflux disease)   . History of hepatitis A   . Osteoporosis   . SCC (squamous cell carcinoma) 05/11/2002   scc in situ bowens left cheek  . Thyroid nodule    per recent [place] has enlarged, but being monitored now    PAST SURGICAL HISTORY:  Past Surgical History:  Procedure Laterality Date  . BACK SURGERY  12/2006   Lumbosacral spine  . COLONOSCOPY    . NASAL SINUS SURGERY    . thyroid lumpectomy  1981  . TONSILLECTOMY    . UPPER GASTROINTESTINAL ENDOSCOPY    . VAGINAL HYSTERECTOMY      FAMILY HISTORY: Family History  Problem Relation Age of Onset  . Diabetes Mother   . COPD Mother   . Hypertension Mother   . Heart attack Father   . Migraines Father   . Migraines Sister   . Stomach cancer Maternal Grandmother   . Other Other        brain tumor  . Colon cancer Neg Hx   . Esophageal cancer Neg Hx   . Rectal cancer Neg Hx     SOCIAL HISTORY:  Social History   Socioeconomic History  . Marital status: Married    Spouse name: Not on file  . Number of children: 1  . Years of education: Not on file  . Highest education level: Not on file  Occupational History  . Occupation: retired  Scientific laboratory technician  . Financial resource strain: Not on file  . Food insecurity    Worry: Not on file    Inability: Not on file  . Transportation needs    Medical: Not on file    Non-medical: Not on file  Tobacco Use  . Smoking status: Former Smoker    Quit date: 08/25/1978    Years since quitting: 40.6  . Smokeless tobacco: Never Used  Substance and Sexual Activity  . Alcohol use: Yes    Alcohol/week: 3.0 standard drinks    Types: 3 Standard drinks or equivalent per week    Comment: occ  . Drug use: No  . Sexual activity: Not on file  Lifestyle  . Physical activity    Days per week: Not on file    Minutes per session: Not on file  . Stress: Not on file  Relationships  . Social Herbalist on phone: Not on file    Gets together: Not on file    Attends religious service: Not on file    Active member of club or organization: Not on file    Attends meetings of clubs or organizations: Not on file    Relationship status: Not on file  . Intimate partner violence    Fear of current or ex partner: Not on file    Emotionally abused: Not on file    Physically abused: Not on file    Forced sexual activity: Not on file  Other Topics Concern  . Not on file  Social History Narrative  . Not on file   PHYSICAL EXAM  Vitals:   03/23/19 0827  BP: 118/82  Pulse: (!) 52  Temp: 97.7 F (36.5 C)  Weight: 152 lb (68.9 kg)  Height: 5' 7.5" (1.715 m)   Body mass index is 23.46 kg/m.  Generalized: Well developed, in no acute distress   Neurological examination  Mentation: Alert oriented to time, place, history taking. Follows all commands speech and language fluent Cranial nerve II-XII: Pupils were equal round reactive to light.  Extraocular movements were full, visual field were full on confrontational test. Facial sensation and strength were normal. Head turning and shoulder shrug  were normal and symmetric. Motor: The motor testing reveals 5 over 5 strength of all 4 extremities. Good symmetric motor tone is noted throughout.  Sensory: Sensory testing is  intact to soft touch on all 4 extremities. No evidence of extinction is noted.  Coordination: Cerebellar testing reveals good finger-nose-finger and heel-to-shin bilaterally.  Gait and station: Gait is normal. Tandem gait is normal. Romberg is negative. No drift is seen.  Reflexes: Deep tendon reflexes are symmetric and normal bilaterally.   DIAGNOSTIC DATA (LABS, IMAGING, TESTING) - I reviewed patient records, labs, notes, testing and imaging myself where available.  Lab Results  Component Value Date   WBC 8.3 07/04/2009   HGB 13.2 07/04/2009   HCT 38.7 07/04/2009   MCV 93.0 07/04/2009   PLT 255.0 07/04/2009      Component Value Date/Time   NA 141 02/25/2007 0320   K 4.4 02/25/2007 0320   CL 108 02/25/2007 0320   CO2 28 02/25/2007 0320   GLUCOSE 108 (H) 02/25/2007 0320   BUN 8 02/25/2007 0320   CREATININE 0.84 02/25/2007 0320   CALCIUM 8.1 (L) 02/25/2007 0320   PROT 6.4 02/19/2007 1528   ALBUMIN 4.0 02/19/2007 1528   AST 19 02/19/2007 1528   ALT 13 02/19/2007 1528   ALKPHOS 62 02/19/2007 1528   BILITOT 0.5 02/19/2007 1528   GFRNONAA >60 02/25/2007 0320   GFRAA  02/25/2007 0320    >60        The eGFR has been calculated using the MDRD equation. This calculation has not been validated in all clinical   Lab Results  Component Value Date   CHOL (H) 02/24/2007    229        ATP III CLASSIFICATION:  <200     mg/dL   Desirable  200-239  mg/dL   Borderline High  >=240    mg/dL   High   HDL 58 02/24/2007   LDLCALC (H) 02/24/2007    158        Total Cholesterol/HDL:CHD Risk Coronary Heart Disease Risk Table                     Men   Women  1/2  Average Risk   3.4   3.3   TRIG 67 02/24/2007   CHOLHDL 3.9 02/24/2007   Lab Results  Component Value Date   HGBA1C  02/25/2007    5.4 (NOTE)   The ADA recommends the following therapeutic goals for glycemic   control related to Hgb A1C measurement:   Goal of Therapy:   < 7.0% Hgb A1C   Action Suggested:  > 8.0% Hgb A1C   Ref:  Diabetes Care, 36, Suppl. 1, 1999   No results found for: VITAMINB12 No results found for: TSH  ASSESSMENT AND PLAN 63 y.o. year old female  has a past medical history of Arthritis, BCC (basal cell carcinoma of skin) (11/22/1993), BCC (basal cell carcinoma of skin) (12/20/1993), Common migraine (08/01/2014), Diverticulosis, GERD (gastroesophageal reflux disease), History of hepatitis A, Osteoporosis, SCC (squamous cell carcinoma) (05/11/2002), and Thyroid nodule. here with:  1.  Migraine headache  Her headaches are under stable control, indicating on average about 4 migraines a month.  With a migraine she will take 2 Advil and Maxalt with relief.  She may try to reduce her dose of Topamax to 25 mg in the morning, 50 mg in the evening, to see if she feels less forgetful.  Of course, if her migraines increase, she can return to her prior dose of Topamax 50 mg twice a day.  She will follow-up in 1 year or sooner if needed.  I advised that if her symptoms  worsen or she develops any new symptoms she should let us know.   I spent 15 minutes with the patient. 50% of this time was spent discussing her plan of care.   [name], AGNP-C, DNP 03/23/2019, 8:59 AM [facility] [address], [place], [place] [address] [phone]

## 2019-03-23 ENCOUNTER — Other Ambulatory Visit: Payer: Self-pay

## 2019-03-23 ENCOUNTER — Ambulatory Visit (INDEPENDENT_AMBULATORY_CARE_PROVIDER_SITE_OTHER): Payer: BC Managed Care – PPO | Admitting: Neurology

## 2019-03-23 ENCOUNTER — Encounter: Payer: Self-pay | Admitting: Neurology

## 2019-03-23 ENCOUNTER — Ambulatory Visit: Payer: BLUE CROSS/BLUE SHIELD | Admitting: Nurse Practitioner

## 2019-03-23 VITALS — BP 118/82 | HR 52 | Temp 97.7°F | Ht 67.5 in | Wt 152.0 lb

## 2019-03-23 DIAGNOSIS — G43009 Migraine without aura, not intractable, without status migrainosus: Secondary | ICD-10-CM

## 2019-03-23 MED ORDER — TOPIRAMATE 50 MG PO TABS: 50.0000 mg | ORAL_TABLET | Freq: Two times a day (BID) | ORAL | 3 refills | Status: DC

## 2019-03-23 NOTE — Progress Notes (Signed)
I have read the note, and I agree with the clinical assessment and plan.  Shwanda Soltis K Vanessa Alesi   

## 2019-03-23 NOTE — Patient Instructions (Signed)
It was wonderful to meet you today! You may adjust your Topamax dose to take 25 mg in the morning, 50 mg at bedtime to see if this helps with your memory. I will see you in 1 year.

## 2019-04-22 ENCOUNTER — Other Ambulatory Visit: Payer: Self-pay | Admitting: Endocrinology

## 2019-04-22 ENCOUNTER — Other Ambulatory Visit (HOSPITAL_COMMUNITY): Payer: Self-pay | Admitting: Endocrinology

## 2019-04-22 DIAGNOSIS — E041 Nontoxic single thyroid nodule: Secondary | ICD-10-CM

## 2019-04-27 ENCOUNTER — Ambulatory Visit (HOSPITAL_COMMUNITY)
Admission: RE | Admit: 2019-04-27 | Discharge: 2019-04-27 | Disposition: A | Discharge status: Home / Self Care | Payer: BC Managed Care – PPO | Source: Ambulatory Visit | Attending: Endocrinology | Admitting: Endocrinology

## 2019-04-27 ENCOUNTER — Other Ambulatory Visit: Payer: Self-pay

## 2019-04-27 DIAGNOSIS — E041 Nontoxic single thyroid nodule: Secondary | ICD-10-CM | POA: Insufficient documentation

## 2019-07-20 ENCOUNTER — Other Ambulatory Visit: Payer: Self-pay

## 2019-07-20 MED ORDER — TOPIRAMATE 50 MG PO TABS: ORAL_TABLET | ORAL | 3 refills | Status: DC

## 2020-02-04 ENCOUNTER — Other Ambulatory Visit: Payer: Self-pay | Admitting: Neurology

## 2020-03-26 ENCOUNTER — Other Ambulatory Visit: Payer: Self-pay

## 2020-03-26 ENCOUNTER — Encounter: Payer: Self-pay | Admitting: Neurology

## 2020-03-26 ENCOUNTER — Ambulatory Visit (INDEPENDENT_AMBULATORY_CARE_PROVIDER_SITE_OTHER): Payer: BC Managed Care – PPO | Admitting: Neurology

## 2020-03-26 VITALS — BP 111/78 | HR 62 | Ht 67.5 in | Wt 159.6 lb

## 2020-03-26 DIAGNOSIS — G43009 Migraine without aura, not intractable, without status migrainosus: Secondary | ICD-10-CM | POA: Diagnosis not present

## 2020-03-26 MED ORDER — TOPIRAMATE 25 MG PO TABS: 25.0000 mg | ORAL_TABLET | Freq: Two times a day (BID) | ORAL | 4 refills | Status: DC

## 2020-03-26 NOTE — Progress Notes (Signed)
PATIENT: [name] DOB: April 25, 1956  REASON FOR VISIT: follow up HISTORY FROM: patient  HISTORY OF PRESENT ILLNESS: Today 03/26/20  [name] is a 64 year old female with history of migraine headaches.  She remains on Topamax and Maxalt PRN.  When last seen, dose of Topamax was decreased 25 mg am/50 mg pm, to see if less side effect of forgetfulness, much improved, she continued to reduce dose 25 mg/25 mg, headaches remain stable.  Much less forgetful.  Hard to quantify headaches (doesn't take all of monthly Maxalt), comes in to cycles, no longer gets the migraines requiring her to be in the bed, or vomiting.  Takes Maxalt with good benefit.  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  Overall health has been good, needs to see PCP.  Presents today for follow-up unaccompanied.  HISTORY 03/23/2019 SS: [name] is a 64 year old female with history of migraine headaches.  She is currently taking Topamax 50 mg twice a day, Maxalt as needed.  She says her migraines are under good control.  She says she may have on average 4 migraines a month.  When she gets a headache she will take 2 Advil, in combination with Maxalt and the headache will abate.  She does feel that her memory may not be as sharp, she cannot remember names as quickly, and she feels forgetful.  She wonders if this may be a side effect of the Topamax.  She indicates her overall health has been good.  She sees her gynecologist on an annual basis.  She follows with endocrinology yearly for a cyst on her thyroid.  She presents today for follow-up unaccompanied.  She is retired and lives with her husband.  They are currently renovating a [place].   REVIEW OF SYSTEMS: Out of a complete 14 system review of symptoms, the patient complains only of the following symptoms, and all other reviewed systems are negative.  Headache  ALLERGIES: No Known Allergies  HOME MEDICATIONS: Outpatient Medications Prior  to Visit  Medication Sig Dispense Refill  . alendronate (FOSAMAX) 70 MG tablet 70 mg every 7 (seven) days.    Marland Kitchen loratadine (CLARITIN) 10 MG tablet Take 10 mg by mouth daily.    . pantoprazole (PROTONIX) 40 MG tablet Take 1 tablet (40 mg total) by mouth daily. 90 tablet 3  . rizatriptan (MAXALT-MLT) 10 MG disintegrating tablet PLACE 1 TABLET ON THE TONGUE AND ALLOW TO DISSOLVE AS NEEDED FOR MIGRAINE; MAY REPEAT IN 2 HOURS IF NEEDED 27 tablet 3  . zolpidem (AMBIEN) 10 MG tablet Take 10 mg by mouth at bedtime as needed.    . topiramate (TOPAMAX) 50 MG tablet 25 mg in the am and 50 mg in the pm (Patient taking differently: 25 mg 2 (two) times daily. ) 180 tablet 3   No facility-administered medications prior to visit.    PAST MEDICAL HISTORY: Past Medical History:  Diagnosis Date  . Arthritis   . BCC (basal cell carcinoma of skin) 11/22/1993   bcc left nasal bridge tx: curet, exc.  Marland Kitchen BCC (basal cell carcinoma of skin) 12/20/1993   bcc + margin left nasal bridge tx:exc.  . Common migraine 08/01/2014  . Diverticulosis   . GERD (gastroesophageal reflux disease)   . History of hepatitis A   . Osteoporosis   . SCC (squamous cell carcinoma) 05/11/2002   scc in situ bowens left cheek  . Thyroid nodule    per recent US, has  enlarged, but being monitored now    PAST SURGICAL HISTORY: Past Surgical History:  Procedure Laterality Date  . BACK SURGERY  12/2006   Lumbosacral spine  . COLONOSCOPY    . NASAL SINUS SURGERY    . thyroid lumpectomy  1981  . TONSILLECTOMY    . UPPER GASTROINTESTINAL ENDOSCOPY    . VAGINAL HYSTERECTOMY      FAMILY HISTORY: Family History  Problem Relation Age of Onset  . Diabetes Mother   . COPD Mother   . Hypertension Mother   . Heart attack Father   . Migraines Father   . Migraines Sister   . Stomach cancer Maternal Grandmother   . Other Other        brain tumor  . Colon cancer Neg Hx   . Esophageal cancer Neg Hx   . Rectal cancer Neg Hx      SOCIAL HISTORY: Social History   Socioeconomic History  . Marital status: Married    Spouse name: Not on file  . Number of children: 1  . Years of education: Not on file  . Highest education level: Not on file  Occupational History  . Occupation: retired  Tobacco Use  . Smoking status: Former Smoker    Quit date: 08/25/1978    Years since quitting: 41.6  . Smokeless tobacco: Never Used  Substance and Sexual Activity  . Alcohol use: Yes    Alcohol/week: 3.0 standard drinks    Types: 3 Standard drinks or equivalent per week    Comment: occ  . Drug use: No  . Sexual activity: Not on file  Other Topics Concern  . Not on file  Social History Narrative  . Not on file   Social Determinants of Health   Financial Resource Strain:   . Difficulty of Paying Living Expenses:   Food Insecurity:   . Worried About Charity fundraiser in the Last Year:   . Arboriculturist in the Last Year:   Transportation Needs:   . Film/video editor (Medical):   Marland Kitchen Lack of Transportation (Non-Medical):   Physical Activity:   . Days of Exercise per Week:   . Minutes of Exercise per Session:   Stress:   . Feeling of Stress :   Social Connections:   . Frequency of Communication with Friends and Family:   . Frequency of Social Gatherings with Friends and Family:   . Attends Religious Services:   . Active Member of Clubs or Organizations:   . Attends Archivist Meetings:   Marland Kitchen Marital Status:   Intimate Partner Violence:   . Fear of Current or Ex-Partner:   . Emotionally Abused:   Marland Kitchen Physically Abused:   . Sexually Abused:    PHYSICAL EXAM  Vitals:   03/26/20 0817  BP: 111/78  Pulse: 62  Weight: 159 lb 9.6 oz (72.4 kg)  Height: 5' 7.5" (1.715 m)   Body mass index is 24.63 kg/m.  Generalized: Well developed, in no acute distress   Neurological examination  Mentation: Alert oriented to time, place, history taking. Follows all commands speech and language fluent Cranial  nerve II-XII: Pupils were equal round reactive to light. Extraocular movements were full, visual field were full on confrontational test. Facial sensation and strength were normal. Head turning and shoulder shrug  were normal and symmetric. Motor: The motor testing reveals 5 over 5 strength of all 4 extremities. Good symmetric motor tone is noted throughout.  Sensory: Sensory testing is  intact to soft touch on all 4 extremities. No evidence of extinction is noted.  Coordination: Cerebellar testing reveals good finger-nose-finger and heel-to-shin bilaterally.  Gait and station: Gait is normal. Tandem gait is normal. Romberg is negative. No drift is seen.  Reflexes: Deep tendon reflexes are symmetric and normal bilaterally.   DIAGNOSTIC DATA (LABS, IMAGING, TESTING) - I reviewed patient records, labs, notes, testing and imaging myself where available.  Lab Results  Component Value Date   WBC 8.3 07/04/2009   HGB 13.2 07/04/2009   HCT 38.7 07/04/2009   MCV 93.0 07/04/2009   PLT 255.0 07/04/2009      Component Value Date/Time   NA 141 02/25/2007 0320   K 4.4 02/25/2007 0320   CL 108 02/25/2007 0320   CO2 28 02/25/2007 0320   GLUCOSE 108 (H) 02/25/2007 0320   BUN 8 02/25/2007 0320   CREATININE 0.84 02/25/2007 0320   CALCIUM 8.1 (L) 02/25/2007 0320   PROT 6.4 02/19/2007 1528   ALBUMIN 4.0 02/19/2007 1528   AST 19 02/19/2007 1528   ALT 13 02/19/2007 1528   ALKPHOS 62 02/19/2007 1528   BILITOT 0.5 02/19/2007 1528   GFRNONAA >60 02/25/2007 0320   GFRAA  02/25/2007 0320    >60        The eGFR has been calculated using the MDRD equation. This calculation has not been validated in all clinical   Lab Results  Component Value Date   CHOL (H) 02/24/2007    229        ATP III CLASSIFICATION:  <200     mg/dL   Desirable  200-239  mg/dL   Borderline High  >=240    mg/dL   High   HDL 58 02/24/2007   LDLCALC (H) 02/24/2007    158        Total Cholesterol/HDL:CHD Risk Coronary Heart  Disease Risk Table                     Men   Women  1/2 Average Risk   3.4   3.3   TRIG 67 02/24/2007   CHOLHDL 3.9 02/24/2007   Lab Results  Component Value Date   HGBA1C  02/25/2007    5.4 (NOTE)   The ADA recommends the following therapeutic goals for glycemic   control related to Hgb A1C measurement:   Goal of Therapy:   < 7.0% Hgb A1C   Action Suggested:  > 8.0% Hgb A1C   Ref:  Diabetes Care, 62, Suppl. 1, 1999   No results found for: VITAMINB12 No results found for: TSH    ASSESSMENT AND PLAN 64 y.o. year old female  has a past medical history of Arthritis, BCC (basal cell carcinoma of skin) (11/22/1993), BCC (basal cell carcinoma of skin) (12/20/1993), Common migraine (08/01/2014), Diverticulosis, GERD (gastroesophageal reflux disease), History of hepatitis A, Osteoporosis, SCC (squamous cell carcinoma) (05/11/2002), and Thyroid nodule. here with:  1.  Chronic migraine headache  Headaches remain stable, she has been able to reduce her dose of Topamax to 25 mg am/25 mg pm.  She will remain on this, along with Maxalt as needed. We talked about new CGRP medications if headaches increase, may need to try other pills, will be on Medicare in 6 months.  She will follow-up in 1 year or sooner if needed.  I spent 20 minutes of face-to-face and non-face-to-face time with patient.  This included previsit chart review, lab review, study review, order entry, electronic health record documentation,  patient education.  [name], AGNP-C, DNP 03/26/2020, 8:40 AM [facility] [address] [place], [place] [address] [phone]

## 2020-03-26 NOTE — Progress Notes (Signed)
I have read the note, and I agree with the clinical assessment and plan.  Jonea Bukowski K Niranjan Rufener   

## 2020-03-26 NOTE — Patient Instructions (Signed)
Continue current medications If migraines increase let me know See you back in 1 year

## 2020-04-16 ENCOUNTER — Telehealth: Payer: Self-pay

## 2020-04-16 NOTE — Telephone Encounter (Signed)
Spoke to [place], then [facility] [facility] pharmacist about discontinuing the 50mg  tablets as pt is taking 25mg  po bid now.  This was done.

## 2020-04-16 NOTE — Telephone Encounter (Signed)
Pharmacy tech from [employer] called with questions about topiramate. Please call [phone] opt 2, Ref # [id number].

## 2020-05-04 ENCOUNTER — Other Ambulatory Visit: Payer: Self-pay | Admitting: Endocrinology

## 2020-05-04 DIAGNOSIS — E041 Nontoxic single thyroid nodule: Secondary | ICD-10-CM

## 2020-08-28 ENCOUNTER — Other Ambulatory Visit: Payer: Self-pay

## 2020-08-28 ENCOUNTER — Encounter: Payer: Self-pay | Admitting: Internal Medicine

## 2020-08-28 ENCOUNTER — Ambulatory Visit (INDEPENDENT_AMBULATORY_CARE_PROVIDER_SITE_OTHER): Payer: Medicare Other | Admitting: Internal Medicine

## 2020-08-28 VITALS — BP 123/85 | HR 65 | Temp 98.3°F | Resp 18 | Ht 67.5 in | Wt 158.4 lb

## 2020-08-28 DIAGNOSIS — H04123 Dry eye syndrome of bilateral lacrimal glands: Secondary | ICD-10-CM | POA: Insufficient documentation

## 2020-08-28 DIAGNOSIS — K5900 Constipation, unspecified: Secondary | ICD-10-CM | POA: Diagnosis not present

## 2020-08-28 DIAGNOSIS — G43009 Migraine without aura, not intractable, without status migrainosus: Secondary | ICD-10-CM

## 2020-08-28 DIAGNOSIS — Z7689 Persons encountering health services in other specified circumstances: Secondary | ICD-10-CM | POA: Diagnosis not present

## 2020-08-28 DIAGNOSIS — K219 Gastro-esophageal reflux disease without esophagitis: Secondary | ICD-10-CM

## 2020-08-28 DIAGNOSIS — K649 Unspecified hemorrhoids: Secondary | ICD-10-CM

## 2020-08-28 DIAGNOSIS — R1310 Dysphagia, unspecified: Secondary | ICD-10-CM

## 2020-08-28 DIAGNOSIS — G4700 Insomnia, unspecified: Secondary | ICD-10-CM

## 2020-08-28 DIAGNOSIS — Z808 Family history of malignant neoplasm of other organs or systems: Secondary | ICD-10-CM | POA: Insufficient documentation

## 2020-08-28 DIAGNOSIS — M810 Age-related osteoporosis without current pathological fracture: Secondary | ICD-10-CM | POA: Diagnosis not present

## 2020-08-28 DIAGNOSIS — E041 Nontoxic single thyroid nodule: Secondary | ICD-10-CM | POA: Insufficient documentation

## 2020-08-28 MED ORDER — CVS ARTIFICIAL TEARS 1-0.3 % OP SOLN: 1.0000 [drp] | Freq: Every day | OPHTHALMIC | 0 refills | Status: AC

## 2020-08-28 MED ORDER — SENNA 8.6 MG PO TABS: 1.0000 | ORAL_TABLET | Freq: Every day | ORAL | 0 refills | Status: DC | PRN

## 2020-08-28 NOTE — Assessment & Plan Note (Signed)
H/o esophageal stricture in the past Has not seen GI for the last 5 years Discussed about GI follow up, patient will get back.

## 2020-08-28 NOTE — Progress Notes (Signed)
New Patient Office Visit  Subjective:  Patient ID: [name], female    DOB: 1956/07/28  Age: 65 y.o. MRN: [id number]  CC:  Chief Complaint  Patient presents with  . 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  Patient complains of rectal pain due to hemorrhoids.  She uses OTC hemorrhoidal cream.  Of note, she has a history of chronic constipation.  She also complains of dysphagia and has a history of esophageal stricture.  She has not followed up with GI in the last 5 years.  Upon discussion, she states that she will get back regarding her decision home to follow for GI.  She mentions about bruising on her right knee sustained from a fall about a year ago.  She mentions noticing 2 knots on the lower thigh around the right knee, which is not warm or red now.  She denies any active pain in the area.  Patient also complains of left eye dryness and irritation due to it.  She wears eyeglasses and had a recent checkup for her eyeglasses.  She also complains of chronic fatigue, but denies any weight changes, tremors, chest pain, palpitations, dyspnea or leg edema.  Last colonoscopy in 04/2012.  Patient follows up with OB/GYN for Pap smear, mammography and DEXA scan.  Patient is up to date with Covid and flu vaccine.  Past Medical History:  Diagnosis Date  . Arthritis   . BCC (basal cell carcinoma of skin) 11/22/1993   bcc left nasal bridge tx: curet, exc.  Marland Kitchen BCC (basal cell carcinoma of skin) 12/20/1993   bcc + margin left nasal bridge tx:exc.  . Common migraine 08/01/2014  . Diverticulosis   . GERD  (gastroesophageal reflux disease)   . History of hepatitis A   . Osteoporosis   . SCC (squamous cell carcinoma) 05/11/2002   scc in situ bowens left cheek  . Thyroid nodule    per recent US, has enlarged, but being monitored now    Past Surgical History:  Procedure Laterality Date  . ABDOMINAL HYSTERECTOMY N/A    Phreesia 08/27/2020  . BACK SURGERY  12/2006   Lumbosacral spine  . COLONOSCOPY    . NASAL SINUS SURGERY    . SPINE SURGERY N/A    Phreesia 08/27/2020  . thyroid lumpectomy  1981  . TONSILLECTOMY    . UPPER GASTROINTESTINAL ENDOSCOPY    . VAGINAL HYSTERECTOMY      Family History  Problem Relation Age of Onset  . Diabetes Mother   . COPD Mother   . Hypertension Mother   . Heart attack Father   . Migraines Father   . Migraines Sister   . Stomach cancer Maternal Grandmother   . Other Other        brain tumor  . Colon cancer Neg Hx   . Esophageal cancer Neg Hx   . Rectal cancer Neg Hx     Social History   Socioeconomic History  . Marital status: Married    Spouse name: Not on file  . Number of children: 1  . Years  of education: Not on file  . Highest education level: Not on file  Occupational History  . Occupation: retired  Tobacco Use  . Smoking status: Former Smoker    Quit date: 08/25/1978    Years since quitting: 42.0  . Smokeless tobacco: Never Used  Substance and Sexual Activity  . Alcohol use: Yes    Alcohol/week: 3.0 standard drinks    Types: 3 Standard drinks or equivalent per week    Comment: occ  . Drug use: No  . Sexual activity: Not on file  Other Topics Concern  . Not on file  Social History Narrative  . Not on file   Social Determinants of Health   Financial Resource Strain: Not on file  Food Insecurity: Not on file  Transportation Needs: Not on file  Physical Activity: Not on file  Stress: Not on file  Social Connections: Not on file  Intimate Partner Violence: Not on file    ROS Review of Systems  Constitutional:  Positive for fatigue. Negative for chills and fever.  HENT: Negative for congestion, sinus pressure, sinus pain and sore throat.   Eyes: Negative for pain and discharge.       Dry eyes  Respiratory: Negative for cough and shortness of breath.   Cardiovascular: Negative for chest pain and palpitations.  Gastrointestinal: Negative for abdominal pain, constipation, diarrhea, nausea and vomiting.  Endocrine: Negative for polydipsia and polyuria.  Genitourinary: Negative for dysuria and hematuria.  Musculoskeletal: Negative for neck pain and neck stiffness.  Skin: Negative for rash.  Neurological: Negative for dizziness and weakness.  Psychiatric/Behavioral: Negative for agitation and behavioral problems.    Objective:   Today's Vitals: BP 123/85 (BP Location: Right Arm, Patient Position: Sitting, Cuff Size: Normal)   Pulse 65   Temp 98.3 F (36.8 C) (Oral)   Resp 18   Ht 5' 7.5" (1.715 m)   Wt 158 lb 6.4 oz (71.8 kg)   SpO2 99%   BMI 24.44 kg/m   Physical Exam Vitals reviewed.  Constitutional:      General: She is not in acute distress.    Appearance: She is not diaphoretic.  HENT:     Head: Normocephalic and atraumatic.     Nose: Nose normal.     Mouth/Throat:     Mouth: Mucous membranes are moist.  Eyes:     General: No scleral icterus.    Extraocular Movements: Extraocular movements intact.     Pupils: Pupils are equal, round, and reactive to light.  Cardiovascular:     Rate and Rhythm: Normal rate and regular rhythm.     Pulses: Normal pulses.     Heart sounds: Normal heart sounds. No murmur heard.   Pulmonary:     Breath sounds: Normal breath sounds. No wheezing or rales.  Abdominal:     Palpations: Abdomen is soft.     Tenderness: There is no abdominal tenderness.  Musculoskeletal:     Cervical back: Neck supple. No tenderness.     Right lower leg: No edema.     Left lower leg: No edema.  Skin:    General: Skin is warm.     Findings: No rash.      Comments: 2 soft tissue mass over right thigh, lipoma-like, around 0.5 cm in diameter each. No warmth or erythema  Neurological:     General: No focal deficit present.     Mental Status: She is alert and oriented to person, place, and time.  Psychiatric:  Mood and Affect: Mood normal.        Behavior: Behavior normal.     Assessment & Plan:   Problem List Items Addressed This Visit      Encounter to establish care - Primary   Care established Previous chart reviewed History and medications reviewed with the patient     Relevant Orders  CBC with Differential  CMP14+EGFR  Hemoglobin A1c  Lipid panel  TSH + free T4  Vitamin D (25 hydroxy)    Cardiovascular and Mediastinum   Common migraine (Chronic)    On Topamax and Maxalt (PRN) Follows up with Neurology      Hemorrhoids    Continue to use OTC hemorrhoidal cream Started Senna and increase water intake for constipation        Digestive   Gastroesophageal reflux disease    On Pantoprazole      Relevant Medications   senna (SENOKOT) 8.6 MG TABS tablet   Dysphagia    H/o esophageal stricture in the past Has not seen GI for the last 5 years Discussed about GI follow up, patient will get back.        Musculoskeletal and Integument   Osteoporosis    On Alendronate        Other   CN (constipation)    Chronic constipation Advised to increase water intake Started Senna      Relevant Medications   senna (SENOKOT) 8.6 MG TABS tablet      Insomnia    Takes Ambien 5 mg PRN      Dry eyes    Artificial tears prescribed Advised to follow up with her Ophthalmologist      Relevant Medications   Propylene Glycol-Glycerin ([employer] ARTIFICIAL TEARS) 1-0.3 % SOLN      Outpatient Encounter Medications as of 08/28/2020  Medication Sig  . alendronate (FOSAMAX) 70 MG tablet 70 mg every 7 (seven) days.  Marland Kitchen loratadine (CLARITIN) 10 MG tablet Take 10 mg by mouth daily.  . pantoprazole (PROTONIX) 40 MG tablet  Take 1 tablet (40 mg total) by mouth daily.  Marland Kitchen Propylene Glycol-Glycerin ([employer] ARTIFICIAL TEARS) 1-0.3 % SOLN Apply 1 drop to eye daily.  . rizatriptan (MAXALT-MLT) 10 MG disintegrating tablet PLACE 1 TABLET ON THE TONGUE AND ALLOW TO DISSOLVE AS NEEDED FOR MIGRAINE; MAY REPEAT IN 2 HOURS IF NEEDED  . senna (SENOKOT) 8.6 MG TABS tablet Take 1 tablet (8.6 mg total) by mouth daily as needed for mild constipation.  . topiramate (TOPAMAX) 25 MG tablet Take 1 tablet (25 mg total) by mouth 2 (two) times daily.  Marland Kitchen zolpidem (AMBIEN) 10 MG tablet Take 10 mg by mouth at bedtime as needed.   No facility-administered encounter medications on file as of 08/28/2020.    Follow-up: Return in about 3 months (around 11/26/2020) for Annual physical.   Lindell Spar, MD

## 2020-08-28 NOTE — Assessment & Plan Note (Signed)
Continue to use OTC hemorrhoidal cream Started Senna and increase water intake for constipation

## 2020-08-28 NOTE — Assessment & Plan Note (Signed)
Artificial tears prescribed Advised to follow up with her Ophthalmologist

## 2020-08-28 NOTE — Patient Instructions (Addendum)
Please continue current medications as prescribed.  Please continue to follow heart healthy diet and perform moderate exercise/walking at least 150 mins/week.  Please increase water intake to at least 2 liters in a day. Please take Senna as prescribed for constipation. If you have loose BM, please do not take Senna on that day.

## 2020-08-28 NOTE — Assessment & Plan Note (Signed)
On Pantoprazole 

## 2020-08-28 NOTE — Assessment & Plan Note (Signed)
On Alendronate 

## 2020-08-28 NOTE — Assessment & Plan Note (Signed)
Takes Ambien 5 mg PRN ?

## 2020-08-28 NOTE — Assessment & Plan Note (Signed)
Chronic constipation Advised to increase water intake Started Senna

## 2020-08-28 NOTE — Assessment & Plan Note (Signed)
Care established Previous chart reviewed History and medications reviewed with the patient 

## 2020-08-28 NOTE — Assessment & Plan Note (Signed)
On Topamax and Maxalt (PRN) Follows up with Neurology

## 2020-08-29 ENCOUNTER — Other Ambulatory Visit: Payer: Self-pay | Admitting: Internal Medicine

## 2020-08-29 DIAGNOSIS — E782 Mixed hyperlipidemia: Secondary | ICD-10-CM

## 2020-08-29 LAB — CBC WITH DIFFERENTIAL/PLATELET
Basophils Absolute: 0.1 10*3/uL (ref 0.0–0.2)
Basos: 1 %
EOS (ABSOLUTE): 0.2 10*3/uL (ref 0.0–0.4)
Eos: 3 %
Hematocrit: 39.8 % (ref 34.0–46.6)
Hemoglobin: 13.5 g/dL (ref 11.1–15.9)
Immature Grans (Abs): 0 10*3/uL (ref 0.0–0.1)
Immature Granulocytes: 0 %
Lymphocytes Absolute: 2.1 10*3/uL (ref 0.7–3.1)
Lymphs: 39 %
MCH: 30.1 pg (ref 26.6–33.0)
MCHC: 33.9 g/dL (ref 31.5–35.7)
MCV: 89 fL (ref 79–97)
Monocytes Absolute: 0.4 10*3/uL (ref 0.1–0.9)
Monocytes: 7 %
Neutrophils Absolute: 2.7 10*3/uL (ref 1.4–7.0)
Neutrophils: 50 %
Platelets: 292 10*3/uL (ref 150–450)
RBC: 4.48 x10E6/uL (ref 3.77–5.28)
RDW: 12.4 % (ref 11.7–15.4)
WBC: 5.4 10*3/uL (ref 3.4–10.8)

## 2020-08-29 LAB — CMP14+EGFR
ALT: 10 IU/L (ref 0–32)
AST: 14 IU/L (ref 0–40)
Albumin/Globulin Ratio: 2.2 (ref 1.2–2.2)
Albumin: 4.7 g/dL (ref 3.8–4.8)
Alkaline Phosphatase: 67 IU/L (ref 44–121)
BUN/Creatinine Ratio: 17 (ref 12–28)
BUN: 17 mg/dL (ref 8–27)
Bilirubin Total: 0.3 mg/dL (ref 0.0–1.2)
CO2: 22 mmol/L (ref 20–29)
Calcium: 9.6 mg/dL (ref 8.7–10.3)
Chloride: 108 mmol/L — ABNORMAL HIGH (ref 96–106)
Creatinine, Ser: 0.98 mg/dL (ref 0.57–1.00)
GFR calc Af Amer: 70 mL/min/{1.73_m2} (ref >59)
GFR calc non Af Amer: 61 mL/min/{1.73_m2} (ref >59)
Globulin, Total: 2.1 g/dL (ref 1.5–4.5)
Glucose: 93 mg/dL (ref 65–99)
Potassium: 5 mmol/L (ref 3.5–5.2)
Sodium: 143 mmol/L (ref 134–144)
Total Protein: 6.8 g/dL (ref 6.0–8.5)

## 2020-08-29 LAB — VITAMIN D 25 HYDROXY (VIT D DEFICIENCY, FRACTURES): Vit D, 25-Hydroxy: 35.2 ng/mL (ref 30.0–100.0)

## 2020-08-29 LAB — TSH+FREE T4
Free T4: 1.19 ng/dL (ref 0.82–1.77)
TSH: 1.68 u[IU]/mL (ref 0.450–4.500)

## 2020-08-29 LAB — LIPID PANEL
Chol/HDL Ratio: 4.5 ratio — ABNORMAL HIGH (ref 0.0–4.4)
Cholesterol, Total: 266 mg/dL — ABNORMAL HIGH (ref 100–199)
HDL: 59 mg/dL (ref >39)
LDL Chol Calc (NIH): 193 mg/dL — ABNORMAL HIGH (ref 0–99)
Triglycerides: 83 mg/dL (ref 0–149)
VLDL Cholesterol Cal: 14 mg/dL (ref 5–40)

## 2020-08-29 LAB — HEMOGLOBIN A1C
Est. average glucose Bld gHb Est-mCnc: 105 mg/dL
Hgb A1c MFr Bld: 5.3 % (ref 4.8–5.6)

## 2020-08-29 MED ORDER — ROSUVASTATIN CALCIUM 10 MG PO TABS: 10.0000 mg | ORAL_TABLET | Freq: Every day | ORAL | 1 refills | Status: DC

## 2020-09-03 ENCOUNTER — Other Ambulatory Visit: Payer: Self-pay

## 2020-09-03 DIAGNOSIS — E782 Mixed hyperlipidemia: Secondary | ICD-10-CM

## 2020-09-03 MED ORDER — ROSUVASTATIN CALCIUM 10 MG PO TABS: 10.0000 mg | ORAL_TABLET | Freq: Every day | ORAL | 1 refills | Status: DC

## 2020-10-08 ENCOUNTER — Other Ambulatory Visit: Payer: Self-pay | Admitting: Obstetrics and Gynecology

## 2020-10-08 DIAGNOSIS — R928 Other abnormal and inconclusive findings on diagnostic imaging of breast: Secondary | ICD-10-CM

## 2020-10-19 ENCOUNTER — Ambulatory Visit: Payer: Medicare Other

## 2020-10-19 ENCOUNTER — Other Ambulatory Visit: Payer: Self-pay

## 2020-10-19 ENCOUNTER — Ambulatory Visit
Admission: RE | Admit: 2020-10-19 | Discharge: 2020-10-19 | Disposition: A | Discharge status: Home / Self Care | Payer: Medicare Other | Source: Ambulatory Visit | Attending: Obstetrics and Gynecology | Admitting: Obstetrics and Gynecology

## 2020-10-19 DIAGNOSIS — R928 Other abnormal and inconclusive findings on diagnostic imaging of breast: Secondary | ICD-10-CM

## 2020-10-30 ENCOUNTER — Encounter: Payer: Self-pay | Admitting: Internal Medicine

## 2020-10-30 ENCOUNTER — Ambulatory Visit (INDEPENDENT_AMBULATORY_CARE_PROVIDER_SITE_OTHER): Payer: Medicare Other | Admitting: Internal Medicine

## 2020-10-30 ENCOUNTER — Other Ambulatory Visit: Payer: Self-pay

## 2020-10-30 VITALS — BP 113/80 | HR 85 | Temp 99.1°F | Resp 18 | Ht 68.0 in | Wt 160.1 lb

## 2020-10-30 DIAGNOSIS — E782 Mixed hyperlipidemia: Secondary | ICD-10-CM | POA: Insufficient documentation

## 2020-10-30 DIAGNOSIS — R1310 Dysphagia, unspecified: Secondary | ICD-10-CM

## 2020-10-30 DIAGNOSIS — K5900 Constipation, unspecified: Secondary | ICD-10-CM | POA: Diagnosis not present

## 2020-10-30 DIAGNOSIS — K219 Gastro-esophageal reflux disease without esophagitis: Secondary | ICD-10-CM | POA: Diagnosis not present

## 2020-10-30 DIAGNOSIS — M79641 Pain in right hand: Secondary | ICD-10-CM | POA: Diagnosis not present

## 2020-10-30 DIAGNOSIS — M79642 Pain in left hand: Secondary | ICD-10-CM

## 2020-10-30 MED ORDER — PANTOPRAZOLE SODIUM 40 MG PO TBEC: 40.0000 mg | DELAYED_RELEASE_TABLET | Freq: Every day | ORAL | 3 refills | Status: DC

## 2020-10-30 NOTE — Assessment & Plan Note (Signed)
Now better with Senna

## 2020-10-30 NOTE — Assessment & Plan Note (Signed)
H/o esophageal stricture in the past, s/p balloon dilation ?Has not seen GI for the last 5 years ?Discussed about GI follow up ?

## 2020-10-30 NOTE — Assessment & Plan Note (Signed)
Lipid profile reviewed Continue Crestor 

## 2020-10-30 NOTE — Assessment & Plan Note (Signed)
Refilled Pantoprazole 

## 2020-10-30 NOTE — Progress Notes (Signed)
Established Patient Office Visit  Subjective:  Patient ID: [name], female    DOB: Sep 09, 1955  Age: 65 y.o. MRN: [id number]  CC:  Chief Complaint  Patient presents with  . Follow-up    2 month follow up     HPI [name] is a 65 year old female with past medical history of osteoporosis, GERD, migraine and insomnia who presents for follow up of GERD and dysphagia.  She has been having difficulty swallowing especially with solid food. She states that she has worse dysphagia with certain food, such as rice. She has not contacted GI yet for follow up for dysphagia. Her acid reflux is better with Pantoprazole and requests refills. Her constipation is better with Senna now.  She c/o b/l hand small joint swelling when she is exposed to heat. She also mentions that her hands turn pale during cold exposure. She denies morning stiffness currently. Denies any redness or warmth currently.  Past Medical History:  Diagnosis Date  . Arthritis   . BCC (basal cell carcinoma of skin) 11/22/1993   bcc left nasal bridge tx: curet, exc.  Marland Kitchen BCC (basal cell carcinoma of skin) 12/20/1993   bcc + margin left nasal bridge tx:exc.  . Common migraine 08/01/2014  . Diverticulosis   . GERD (gastroesophageal reflux disease)   . History of hepatitis A   . Osteoporosis   . SCC (squamous cell carcinoma) 05/11/2002   scc in situ bowens left cheek  . Thyroid nodule    per recent US, has enlarged, but being monitored now    Past Surgical History:  Procedure Laterality Date  . ABDOMINAL HYSTERECTOMY N/A    [facility]reesia 08/27/2020  . BACK SURGERY  12/2006   Lumbosacral spine  . COLONOSCOPY    . NASAL SINUS SURGERY    . SPINE SURGERY N/A    [facility]reesia 08/27/2020  . thyroid lumpectomy  1981  . TONSILLECTOMY    . UPPER GASTROINTESTINAL ENDOSCOPY    . VAGINAL HYSTERECTOMY      Family History  Problem Relation Age of Onset  . Diabetes Mother   . COPD Mother   . Hypertension Mother   . Heart  attack Father   . Migraines Father   . Migraines Sister   . Stomach cancer Maternal Grandmother   . Other Other        brain tumor  . Colon cancer Neg Hx   . Esophageal cancer Neg Hx   . Rectal cancer Neg Hx     Social History   Socioeconomic History  . Marital status: Married    Spouse name: Not on file  . Number of children: 1  . Years of education: Not on file  . Highest education level: Not on file  Occupational History  . Occupation: retired  Tobacco Use  . Smoking status: Former Smoker    Quit date: 08/25/1978    Years since quitting: 42.2  . Smokeless tobacco: Never Used  Substance and Sexual Activity  . Alcohol use: Yes    Alcohol/week: 3.0 standard drinks    Types: 3 Standard drinks or equivalent per week    Comment: occ  . Drug use: No  . Sexual activity: Not on file  Other Topics Concern  . Not on file  Social History Narrative  . Not on file   Social Determinants of Health   Financial Resource Strain: Not on file  Food Insecurity: Not on file  Transportation Needs: Not on file  Physical Activity: Not  on file  Stress: Not on file  Social Connections: Not on file  Intimate Partner Violence: Not on file    Outpatient Medications Prior to Visit  Medication Sig Dispense Refill  . alendronate (FOSAMAX) 70 MG tablet 70 mg every 7 (seven) days.    Marland Kitchen loratadine (CLARITIN) 10 MG tablet Take 10 mg by mouth daily.    Marland Kitchen Propylene Glycol-Glycerin ([employer] ARTIFICIAL TEARS) 1-0.3 % SOLN Apply 1 drop to eye daily. 15 mL 0  . rizatriptan (MAXALT-MLT) 10 MG disintegrating tablet PLACE 1 TABLET ON THE TONGUE AND ALLOW TO DISSOLVE AS NEEDED FOR MIGRAINE; MAY REPEAT IN 2 HOURS IF NEEDED 27 tablet 3  . rosuvastatin (CRESTOR) 10 MG tablet Take 1 tablet (10 mg total) by mouth daily. 90 tablet 1  . senna (SENOKOT) 8.6 MG TABS tablet Take 1 tablet (8.6 mg total) by mouth daily as needed for mild constipation. 120 tablet 0  . topiramate (TOPAMAX) 25 MG tablet Take 1 tablet (25 mg  total) by mouth 2 (two) times daily. 180 tablet 4  . zolpidem (AMBIEN) 10 MG tablet Take 10 mg by mouth at bedtime as needed.    . pantoprazole (PROTONIX) 40 MG tablet Take 1 tablet (40 mg total) by mouth daily. 90 tablet 3   No facility-administered medications prior to visit.    No Known Allergies  ROS Review of Systems  Constitutional: Positive for fatigue. Negative for chills and fever.  HENT: Negative for congestion, sinus pressure, sinus pain and sore throat.   Eyes: Negative for pain and discharge.       Dry eyes  Respiratory: Negative for cough and shortness of breath.   Cardiovascular: Negative for chest pain and palpitations.  Gastrointestinal: Negative for abdominal pain, constipation, diarrhea, nausea and vomiting.       Dysphagia  Endocrine: Negative for polydipsia and polyuria.  Genitourinary: Negative for dysuria and hematuria.  Musculoskeletal: Negative for neck pain and neck stiffness.  Skin: Negative for rash.  Neurological: Negative for dizziness and weakness.  Psychiatric/Behavioral: Negative for agitation and behavioral problems.      Objective:    Physical Exam Vitals reviewed.  Constitutional:      General: She is not in acute distress.    Appearance: She is not diaphoretic.  HENT:     Head: Normocephalic and atraumatic.     Nose: Nose normal.     Mouth/Throat:     Mouth: Mucous membranes are moist.  Eyes:     General: No scleral icterus.    Extraocular Movements: Extraocular movements intact.     Pupils: Pupils are equal, round, and reactive to light.  Cardiovascular:     Rate and Rhythm: Normal rate and regular rhythm.     Pulses: Normal pulses.     Heart sounds: Normal heart sounds. No murmur heard.   Pulmonary:     Breath sounds: Normal breath sounds. No wheezing or rales.  Abdominal:     Palpations: Abdomen is soft.     Tenderness: There is no abdominal tenderness.  Musculoskeletal:     Cervical back: Neck supple. No tenderness.      Right lower leg: No edema.     Left lower leg: No edema.  Skin:    General: Skin is warm.     Findings: No rash.     Comments: 2 soft tissue mass over right thigh, lipoma-like, around 0.5 cm in diameter each. No warmth or erythema  Neurological:     General: No focal deficit  present.     Mental Status: She is alert and oriented to person, place, and time.  Psychiatric:        Mood and Affect: Mood normal.        Behavior: Behavior normal.     BP 113/80 (BP Location: Right Arm, Patient Position: Sitting, Cuff Size: Normal)   Pulse 85   Temp 99.1 F (37.3 C) (Oral)   Resp 18   Ht 5\' 8"  (1.727 m)   Wt 160 lb 1.9 oz (72.6 kg)   SpO2 100%   BMI 24.35 kg/m  Wt Readings from Last 3 Encounters:  10/30/20 160 lb 1.9 oz (72.6 kg)  08/28/20 158 lb 6.4 oz (71.8 kg)  03/26/20 159 lb 9.6 oz (72.4 kg)     Health Maintenance Due  Topic Date Due  . Hepatitis C Screening  Never done  . HIV Screening  Never done  . TETANUS/TDAP  Never done  . MAMMOGRAM  03/26/2015  . PAP SMEAR-Modifier  04/10/2018  . DEXA SCAN  Never done  . PNA vac Low Risk Adult (1 of 2 - PCV13) Never done    There are no preventive care reminders to display for this patient.  Lab Results  Component Value Date   TSH 1.680 08/28/2020   Lab Results  Component Value Date   WBC 5.4 08/28/2020   HGB 13.5 08/28/2020   HCT 39.8 08/28/2020   MCV 89 08/28/2020   PLT 292 08/28/2020   Lab Results  Component Value Date   NA 143 08/28/2020   K 5.0 08/28/2020   CO2 22 08/28/2020   GLUCOSE 93 08/28/2020   BUN 17 08/28/2020   CREATININE 0.98 08/28/2020   BILITOT 0.3 08/28/2020   ALKPHOS 67 08/28/2020   AST 14 08/28/2020   ALT 10 08/28/2020   PROT 6.8 08/28/2020   ALBUMIN 4.7 08/28/2020   CALCIUM 9.6 08/28/2020   Lab Results  Component Value Date   CHOL 266 (H) 08/28/2020   Lab Results  Component Value Date   HDL 59 08/28/2020   Lab Results  Component Value Date   LDLCALC 193 (H) 08/28/2020   Lab  Results  Component Value Date   TRIG 83 08/28/2020   Lab Results  Component Value Date   CHOLHDL 4.5 (H) 08/28/2020   Lab Results  Component Value Date   HGBA1C 5.3 08/28/2020      Assessment & Plan:   Problem List Items Addressed This Visit      Digestive   Gastroesophageal reflux disease - Primary    Refilled Pantoprazole      Relevant Medications   pantoprazole (PROTONIX) 40 MG tablet   Dysphagia    H/o esophageal stricture in the past, s/p balloon dilation Has not seen GI for the last 5 years Discussed about GI follow up        Other   CN (constipation)    Now better with Senna      Mixed hyperlipidemia    Lipid profile reviewed Continue Crestor      Relevant Orders   Basic Metabolic Panel (BMET)   Lipid Profile    Other Visit Diagnoses    Pain in both hands   Could be related to arthritic changes Advised simple exercises, material provided Has heat and cold sensitivity, could be primary Raynaud's disease, avoid cold exposure        Meds ordered this encounter  Medications  . pantoprazole (PROTONIX) 40 MG tablet    Sig: Take 1  tablet (40 mg total) by mouth daily.    Dispense:  90 tablet    Refill:  3    Follow-up: Return in about 6 months (around 05/02/2021) for HLD and dysphagia.    Lindell Spar, MD

## 2020-10-30 NOTE — Patient Instructions (Addendum)
Please continue to take medications as prescribed.  Please contact your GI specialist to discuss swallowing difficulty concern.  Avoid hot and spicy food. Hand Exercises Hand exercises can be helpful for almost anyone. These exercises can strengthen the hands, improve flexibility and movement, and increase blood flow to the hands. These results can make work and daily tasks easier. Hand exercises can be especially helpful for people who have joint pain from arthritis or have nerve damage from overuse (carpal tunnel syndrome). These exercises can also help people who have injured a hand. Exercises Most of these hand exercises are gentle stretching and motion exercises. It is usually safe to do them often throughout the day. Warming up your hands before exercise may help to reduce stiffness. You can do this with gentle massage or by placing your hands in warm water for 10-15 minutes. It is normal to feel some stretching, pulling, tightness, or mild discomfort as you begin new exercises. This will gradually improve. Stop an exercise right away if you feel sudden, severe pain or your pain gets worse. Ask your health care provider which exercises are best for you. Knuckle bend or "claw" fist 1. Stand or sit with your arm, hand, and all five fingers pointed straight up. Make sure to keep your wrist straight during the exercise. 2. Gently bend your fingers down toward your palm until the tips of your fingers are touching the top of your palm. Keep your big knuckle straight and just bend the small knuckles in your fingers. 3. Hold this position for ___5_______ seconds. 4. Straighten (extend) your fingers back to the starting position. Repeat this exercise 5-10 times with each hand. Full finger fist 1. Stand or sit with your arm, hand, and all five fingers pointed straight up. Make sure to keep your wrist straight during the exercise. 2. Gently bend your fingers into your palm until the tips of your fingers  are touching the middle of your palm. 3. Hold this position for _____5_____ seconds. 4. Extend your fingers back to the starting position, stretching every joint fully. Repeat this exercise 5-10 times with each hand. Straight fist 1. Stand or sit with your arm, hand, and all five fingers pointed straight up. Make sure to keep your wrist straight during the exercise. 2. Gently bend your fingers at the big knuckle, where your fingers meet your hand, and the middle knuckle. Keep the knuckle at the tips of your fingers straight and try to touch the bottom of your palm. 3. Hold this position for ____5______ seconds. 4. Extend your fingers back to the starting position, stretching every joint fully. Repeat this exercise 5-10 times with each hand. Tabletop 1. Stand or sit with your arm, hand, and all five fingers pointed straight up. Make sure to keep your wrist straight during the exercise. 2. Gently bend your fingers at the big knuckle, where your fingers meet your hand, as far down as you can while keeping the small knuckles in your fingers straight. Think of forming a tabletop with your fingers. 3. Hold this position for ____5______ seconds. 4. Extend your fingers back to the starting position, stretching every joint fully. Repeat this exercise 5-10 times with each hand. Finger spread 1. Place your hand flat on a table with your palm facing down. Make sure your wrist stays straight as you do this exercise. 2. Spread your fingers and thumb apart from each other as far as you can until you feel a gentle stretch. Hold this position for ____5______  seconds. 3. Bring your fingers and thumb tight together again. Hold this position for ____5______ seconds. Repeat this exercise 5-10 times with each hand. Making circles 1. Stand or sit with your arm, hand, and all five fingers pointed straight up. Make sure to keep your wrist straight during the exercise. 2. Make a circle by touching the tip of your thumb  to the tip of your index finger. 3. Hold for _____5_____ seconds. Then open your hand wide. 4. Repeat this motion with your thumb and each finger on your hand. Repeat this exercise 5-10 times with each hand. Thumb motion 1. Sit with your forearm resting on a table and your wrist straight. Your thumb should be facing up toward the ceiling. Keep your fingers relaxed as you move your thumb. 2. Lift your thumb up as high as you can toward the ceiling. Hold for __5________ seconds. 3. Bend your thumb across your palm as far as you can, reaching the tip of your thumb for the small finger (pinkie) side of your palm. Hold for __________ seconds. Repeat this exercise 5-10 times with each hand. Grip strengthening 1. Hold a stress ball or other soft ball in the middle of your hand. 2. Slowly increase the pressure, squeezing the ball as much as you can without causing pain. Think of bringing the tips of your fingers into the middle of your palm. All of your finger joints should bend when doing this exercise. 3. Hold your squeeze for ___5_______ seconds, then relax. Repeat this exercise 5-10 times with each hand.   Contact a health care provider if:  Your hand pain or discomfort gets much worse when you do an exercise.  Your hand pain or discomfort does not improve within 2 hours after you exercise. If you have any of these problems, stop doing these exercises right away. Do not do them again unless your health care provider says that you can. Get help right away if:  You develop sudden, severe hand pain or swelling. If this happens, stop doing these exercises right away. Do not do them again unless your health care provider says that you can. This information is not intended to replace advice given to you by your health care provider. Make sure you discuss any questions you have with your health care provider. Document Revised: 12/02/2018 Document Reviewed: 08/12/2018 Elsevier Patient Education  2021  [employer].

## 2021-03-26 ENCOUNTER — Ambulatory Visit: Payer: Medicare Other | Admitting: Neurology

## 2021-04-17 ENCOUNTER — Ambulatory Visit: Payer: Medicare Other | Admitting: Neurology

## 2021-04-17 ENCOUNTER — Other Ambulatory Visit: Payer: Self-pay

## 2021-04-17 ENCOUNTER — Encounter: Payer: Self-pay | Admitting: Neurology

## 2021-04-17 VITALS — BP 125/87 | HR 75 | Ht 67.5 in | Wt 160.0 lb

## 2021-04-17 DIAGNOSIS — G43009 Migraine without aura, not intractable, without status migrainosus: Secondary | ICD-10-CM

## 2021-04-17 MED ORDER — PROPRANOLOL HCL 20 MG PO TABS: ORAL_TABLET | ORAL | 3 refills | Status: DC

## 2021-04-17 MED ORDER — NARATRIPTAN HCL 2.5 MG PO TABS: 2.5000 mg | ORAL_TABLET | Freq: Two times a day (BID) | ORAL | 3 refills | Status: DC

## 2021-04-17 NOTE — Progress Notes (Signed)
Reason for visit: Migraine headache  [name] is an 65 y.o. female  History of present illness:  [name] is a 65 year old right-handed white female with a history of common migraine headache.  The patient has been on Topamax for a number of years, in the past she was increased to a dose of 100 mg daily but could not tolerate it secondary to cognitive side effects.  She has reduced the dose to 25 mg twice daily but still has some mild cognitive side effects and she still has at the least 10 headache days a month.  She may go a week or 2 without any headache but then when she does get a headache they will last 5 to 7 days.  The patient is able to function through the headache.  She denies any nausea or vomiting.  She indicates that weather changes are oftentimes an activator for her headache.  She currently is retired.  She takes Maxalt when she gets the headache, but it tends to wear off and the headache comes back.  She is also being followed for problems with osteoporosis, gastroesophageal reflux disease, and chronic insomnia.  Past Medical History:  Diagnosis Date   Arthritis    BCC (basal cell carcinoma of skin) 11/22/1993   bcc left nasal bridge tx: curet, exc.   BCC (basal cell carcinoma of skin) 12/20/1993   bcc + margin left nasal bridge tx:exc.   Common migraine 08/01/2014   Diverticulosis    GERD (gastroesophageal reflux disease)    History of hepatitis A    Osteoporosis    SCC (squamous cell carcinoma) 05/11/2002   scc in situ bowens left cheek   Thyroid nodule    per recent US, has enlarged, but being monitored now    Past Surgical History:  Procedure Laterality Date   ABDOMINAL HYSTERECTOMY N/A    [facility]ia 08/27/2020   BACK SURGERY  12/2006   Lumbosacral spine   COLONOSCOPY     NASAL SINUS SURGERY     SPINE SURGERY N/A    [facility]ia 08/27/2020   thyroid lumpectomy  1981   TONSILLECTOMY     UPPER GASTROINTESTINAL ENDOSCOPY     VAGINAL HYSTERECTOMY       Family History  Problem Relation Age of Onset   Diabetes Mother    COPD Mother    Hypertension Mother    Heart attack Father    Migraines Father    Migraines Sister    Stomach cancer Maternal Grandmother    Other Other        brain tumor   Colon cancer Neg Hx    Esophageal cancer Neg Hx    Rectal cancer Neg Hx     Social history:  reports that she quit smoking about 42 years ago. Her smoking use included cigarettes. She has never used smokeless tobacco. She reports current alcohol use of about 3.0 standard drinks per week. She reports that she does not use drugs.   No Known Allergies  Medications:  Prior to Admission medications   Medication Sig Start Date End Date Taking? Authorizing Provider  alendronate (FOSAMAX) 70 MG tablet 70 mg every 7 (seven) days. 03/14/18   [provider]  loratadine (CLARITIN) 10 MG tablet Take 10 mg by mouth daily.    [provider]  pantoprazole (PROTONIX) 40 MG tablet Take 1 tablet (40 mg total) by mouth daily. 10/30/20   Lindell Spar, MD  Propylene Glycol-Glycerin ([employer] ARTIFICIAL TEARS) 1-0.3 %  SOLN Apply 1 drop to eye daily. 08/28/20   Lindell Spar, MD  rizatriptan (MAXALT-MLT) 10 MG disintegrating tablet PLACE 1 TABLET ON THE TONGUE AND ALLOW TO DISSOLVE AS NEEDED FOR MIGRAINE; MAY REPEAT IN 2 HOURS IF NEEDED 02/06/20   Suzzanne Cloud, NP  rosuvastatin (CRESTOR) 10 MG tablet Take 1 tablet (10 mg total) by mouth daily. 09/03/20   Lindell Spar, MD  senna (SENOKOT) 8.6 MG TABS tablet Take 1 tablet (8.6 mg total) by mouth daily as needed for mild constipation. 08/28/20   Lindell Spar, MD  topiramate (TOPAMAX) 25 MG tablet Take 1 tablet (25 mg total) by mouth 2 (two) times daily. 03/26/20   Suzzanne Cloud, NP  zolpidem (AMBIEN) 10 MG tablet Take 10 mg by mouth at bedtime as needed. 02/21/20   [provider]    ROS:  Out of a complete 14 system review of symptoms, the patient complains only of the following symptoms,  and all other reviewed systems are negative.  Headache Joint pains Insomnia  Blood pressure 125/87, pulse 75, height 5' 7.5" (1.715 m), weight 160 lb (72.6 kg).  Physical Exam  General: The patient is alert and cooperative at the time of the examination.  Skin: No significant peripheral edema is noted.   Neurologic Exam  Mental status: The patient is alert and oriented x 3 at the time of the examination. The patient has apparent normal recent and remote memory, with an apparently normal attention span and concentration ability.   Cranial nerves: Facial symmetry is present. Speech is normal, no aphasia or dysarthria is noted. Extraocular movements are full. Visual fields are full.  Motor: The patient has good strength in all 4 extremities.  Sensory examination: Soft touch sensation is symmetric on the face, arms, and legs.  Coordination: The patient has good finger-nose-finger and heel-to-shin bilaterally.  Gait and station: The patient has a normal gait. Tandem gait is normal. Romberg is negative. No drift is seen.  Reflexes: Deep tendon reflexes are symmetric.   Assessment/Plan:  1.  Common migraine headache, intractable  The patient is still having a significant number of headache days during the month.  We discussed potentially going on an injectable medication such as Aimovig, but the patient is unsure if she wants to go on medication that involves a needle.  She will be given a trial on propranolol working up to 40 mg twice daily, if this is very effective, we may withdraw the Topamax, she will contact our office.  The patient will be switched to Amerge rather than Maxalt as her headaches are prolonged in nature and she is rebounding off of the Maxalt when she gets a headache.  She will follow-up here in 6 months.  In the future, she can be followed through Dr. Billey Gosling.  Jill Alexanders MD 04/17/2021 7:48 AM  [facility] [address] [place], [place] [address]  Phone [phone] Fax [phone]

## 2021-04-17 NOTE — Patient Instructions (Signed)
We will start propranolol for headache prevention. Use amerge if needed for the headache, do not use maxalt and amerge together.  Inderal (propranolol) is a blood pressure medication that is commonly used for migraine headaches. This is a type of beta blocker. The most common side effects include low heart rate, dizziness, fatigue, and increased depression. This medication may worsen asthma. If you believe that you are having side effects on this medication, please contact our office.   Consider the use of Aimovig, Emaglity or Ajovy in the future.

## 2021-04-19 LAB — LIPID PANEL
Chol/HDL Ratio: 2.8 ratio (ref 0.0–4.4)
Cholesterol, Total: 165 mg/dL (ref 100–199)
HDL: 58 mg/dL (ref >39)
LDL Chol Calc (NIH): 94 mg/dL (ref 0–99)
Triglycerides: 68 mg/dL (ref 0–149)
VLDL Cholesterol Cal: 13 mg/dL (ref 5–40)

## 2021-04-19 LAB — BASIC METABOLIC PANEL
BUN/Creatinine Ratio: 17 (ref 12–28)
BUN: 17 mg/dL (ref 8–27)
CO2: 21 mmol/L (ref 20–29)
Calcium: 9.2 mg/dL (ref 8.7–10.3)
Chloride: 108 mmol/L — ABNORMAL HIGH (ref 96–106)
Creatinine, Ser: 0.99 mg/dL (ref 0.57–1.00)
Glucose: 89 mg/dL (ref 65–99)
Potassium: 4.6 mmol/L (ref 3.5–5.2)
Sodium: 144 mmol/L (ref 134–144)
eGFR: 63 mL/min/{1.73_m2} (ref >59)

## 2021-05-01 ENCOUNTER — Encounter: Payer: Self-pay | Admitting: Internal Medicine

## 2021-05-01 ENCOUNTER — Ambulatory Visit (INDEPENDENT_AMBULATORY_CARE_PROVIDER_SITE_OTHER): Payer: Medicare Other | Admitting: Internal Medicine

## 2021-05-01 ENCOUNTER — Other Ambulatory Visit: Payer: Self-pay

## 2021-05-01 VITALS — BP 120/81 | HR 68 | Temp 98.6°F | Resp 18 | Ht 68.0 in | Wt 159.1 lb

## 2021-05-01 DIAGNOSIS — Z0000 Encounter for general adult medical examination without abnormal findings: Secondary | ICD-10-CM

## 2021-05-01 DIAGNOSIS — Z23 Encounter for immunization: Secondary | ICD-10-CM | POA: Diagnosis not present

## 2021-05-01 DIAGNOSIS — Z0001 Encounter for general adult medical examination with abnormal findings: Secondary | ICD-10-CM | POA: Diagnosis not present

## 2021-05-01 NOTE — Progress Notes (Signed)
Subjective:    [name] is a 65 y.o. female who presents for a Welcome to Medicare exam.   Review of Systems Review of Systems  Constitutional: Positive for fatigue. Negative for chills and fever.  HENT: Negative for congestion, sinus pressure, sinus pain and sore throat.   Eyes: Negative for pain and discharge.       Dry eyes  Respiratory: Negative for cough and shortness of breath.   Cardiovascular: Negative for chest pain and palpitations.  Gastrointestinal: Negative for abdominal pain, constipation, diarrhea, nausea and vomiting.       Dysphagia  Endocrine: Negative for polydipsia and polyuria.  Genitourinary: Negative for dysuria and hematuria.  Musculoskeletal: Negative for neck pain and neck stiffness.  Skin: Negative for rash.  Neurological: Negative for dizziness and weakness.  Psychiatric/Behavioral: Negative for agitation and behavioral problems.         Objective:    Today's Vitals   05/01/21 0806  BP: 120/81  Pulse: 68  Resp: 18  Temp: 98.6 F (37 C)  TempSrc: Oral  SpO2: 98%  Weight: 159 lb 1.3 oz (72.2 kg)  Height: '5\' 8"'$  (1.727 m)  PainSc: 0-No pain  Body mass index is 24.19 kg/m.  Medications Outpatient Encounter Medications as of 05/01/2021  Medication Sig   alendronate (FOSAMAX) 70 MG tablet 70 mg every 7 (seven) days.   loratadine (CLARITIN) 10 MG tablet Take 10 mg by mouth daily.   naratriptan (AMERGE) 2.5 MG tablet Take 1 tablet (2.5 mg total) by mouth 2 (two) times daily. Take one (1) tablet at onset of headache; if returns or does not resolve, may repeat after 4 hours; do not exceed five (5) mg in 24 hours.   pantoprazole (PROTONIX) 40 MG tablet Take 1 tablet (40 mg total) by mouth daily.   propranolol (INDERAL) 20 MG tablet One tablet twice a day for 2 weeks, then take 2 tablets twice a day   Propylene Glycol-Glycerin ([employer] ARTIFICIAL TEARS) 1-0.3 % SOLN Apply 1 drop to eye daily.   rosuvastatin (CRESTOR) 10 MG tablet Take 1 tablet (10  mg total) by mouth daily.   senna (SENOKOT) 8.6 MG TABS tablet Take 1 tablet (8.6 mg total) by mouth daily as needed for mild constipation.   topiramate (TOPAMAX) 25 MG tablet Take 1 tablet (25 mg total) by mouth 2 (two) times daily.   zolpidem (AMBIEN) 10 MG tablet Take 10 mg by mouth at bedtime as needed.   No facility-administered encounter medications on file as of 05/01/2021.     History: Past Medical History:  Diagnosis Date   Arthritis    BCC (basal cell carcinoma of skin) 11/22/1993   bcc left nasal bridge tx: curet, exc.   BCC (basal cell carcinoma of skin) 12/20/1993   bcc + margin left nasal bridge tx:exc.   Common migraine 08/01/2014   Diverticulosis    GERD (gastroesophageal reflux disease)    History of hepatitis A    Osteoporosis    SCC (squamous cell carcinoma) 05/11/2002   scc in situ bowens left cheek   Thyroid nodule    per recent US, has enlarged, but being monitored now   Past Surgical History:  Procedure Laterality Date   ABDOMINAL HYSTERECTOMY N/A    [facility]ia 08/27/2020   BACK SURGERY  12/2006   Lumbosacral spine   COLONOSCOPY     NASAL SINUS SURGERY     SPINE SURGERY N/A    [facility]ia 08/27/2020   thyroid lumpectomy  1981  TONSILLECTOMY     UPPER GASTROINTESTINAL ENDOSCOPY     VAGINAL HYSTERECTOMY      Family History  Problem Relation Age of Onset   Diabetes Mother    COPD Mother    Hypertension Mother    Heart attack Father    Migraines Father    Migraines Sister    Stomach cancer Maternal Grandmother    Other Other        brain tumor   Colon cancer Neg Hx    Esophageal cancer Neg Hx    Rectal cancer Neg Hx    Social History   Occupational History   Occupation: retired  Tobacco Use   Smoking status: Former    Types: Cigarettes    Quit date: 08/25/1978    Years since quitting: 42.7   Smokeless tobacco: Never  Substance and Sexual Activity   Alcohol use: Yes    Alcohol/week: 3.0 standard drinks    Types: 3 Standard drinks or  equivalent per week    Comment: occ   Drug use: No   Sexual activity: Not on file    Tobacco Counseling Counseling given: Not Answered   Immunizations and Health Maintenance Immunization History  Administered Date(s) Administered   Influenza, Quadrivalent, Recombinant, Inj, Pf 05/06/2018   Influenza,inj,Quad PF,6+ Mos 08/22/2017, 07/07/2019   Influenza-Unspecified 05/07/2016   Moderna Sars-Covid-2 Vaccination 09/30/2019, 10/29/2019, 06/28/2020, 01/10/2021   Health Maintenance Due  Topic Date Due   HIV Screening  Never done   Hepatitis C Screening  Never done   TETANUS/TDAP  Never done   Zoster Vaccines- Shingrix (1 of 2) Never done   MAMMOGRAM  03/26/2015   PAP SMEAR-Modifier  04/10/2018   DEXA SCAN  Never done   PNA vac Low Risk Adult (1 of 2 - PCV13) Never done   INFLUENZA VACCINE  03/25/2021    Activities of Daily Living In your present state of health, do you have any difficulty performing the following activities: 08/28/2020  Hearing? N  Vision? N  Difficulty concentrating or making decisions? N  Walking or climbing stairs? N  Dressing or bathing? N  Doing errands, shopping? N  Some recent data might be hidden    Physical Exam  (optional), or other factors deemed appropriate based on the beneficiary's medical and social history and current clinical standards.  Advanced Directives:      Assessment:    This is a routine wellness examination for this patient .   Vision/Hearing screen No results found.  Dietary issues and exercise activities discussed:      Goals   None   Depression Screen PHQ 2/9 Scores 10/30/2020 08/28/2020  PHQ - 2 Score 0 0     Fall Risk Fall Risk  10/30/2020  Falls in the past year? 0  Number falls in past yr: 0  Injury with Fall? 0  Risk for fall due to : No Fall Risks  Follow up Falls evaluation completed    Cognitive Function:        Patient Care Team: Lindell Spar, MD as PCP - General (Internal Medicine)      Plan:     I have personally reviewed and noted the following in the patient's chart:   Medical and social history Use of alcohol, tobacco or illicit drugs  Current medications and supplements Functional ability and status Nutritional status Physical activity Advanced directives List of other physicians Hospitalizations, surgeries, and ER visits in previous 12 months Vitals Screenings to include cognitive, depression, and falls  Referrals and appointments  In addition, I have reviewed and discussed with patient certain preventive protocols, quality metrics, and best practice recommendations. A written personalized care plan for preventive services as well as general preventive health recommendations were provided to patient.     Shelda Altes, CMA 05/01/2021  --------------------------------------------------------------------------------------------   I have reviewed and agree with the above AWV documentation.  Addendum:  EKG: Sinus rhythm. HR 61. Nonspecific T wave flattening inn leads III, aVF, V2 and V3. No signs of active ischemia.   Ihor Dow, MD [place]

## 2021-05-29 ENCOUNTER — Other Ambulatory Visit: Payer: Self-pay | Admitting: Internal Medicine

## 2021-05-29 DIAGNOSIS — E782 Mixed hyperlipidemia: Secondary | ICD-10-CM

## 2021-10-07 LAB — HM MAMMOGRAPHY

## 2021-10-07 LAB — HM DEXA SCAN

## 2021-10-08 ENCOUNTER — Other Ambulatory Visit: Payer: Self-pay | Admitting: Obstetrics and Gynecology

## 2021-10-08 DIAGNOSIS — Z8249 Family history of ischemic heart disease and other diseases of the circulatory system: Secondary | ICD-10-CM

## 2021-10-17 ENCOUNTER — Ambulatory Visit: Payer: Medicare Other | Admitting: Neurology

## 2021-10-29 ENCOUNTER — Encounter: Payer: Self-pay | Admitting: Internal Medicine

## 2021-10-29 ENCOUNTER — Other Ambulatory Visit: Payer: Self-pay

## 2021-10-29 ENCOUNTER — Ambulatory Visit (INDEPENDENT_AMBULATORY_CARE_PROVIDER_SITE_OTHER): Payer: Medicare Other | Admitting: Internal Medicine

## 2021-10-29 VITALS — BP 124/72 | HR 68 | Resp 18 | Ht 67.5 in | Wt 167.2 lb

## 2021-10-29 DIAGNOSIS — Z0001 Encounter for general adult medical examination with abnormal findings: Secondary | ICD-10-CM | POA: Diagnosis not present

## 2021-10-29 DIAGNOSIS — K219 Gastro-esophageal reflux disease without esophagitis: Secondary | ICD-10-CM

## 2021-10-29 DIAGNOSIS — Z0000 Encounter for general adult medical examination without abnormal findings: Secondary | ICD-10-CM

## 2021-10-29 DIAGNOSIS — R1310 Dysphagia, unspecified: Secondary | ICD-10-CM

## 2021-10-29 DIAGNOSIS — G4700 Insomnia, unspecified: Secondary | ICD-10-CM | POA: Diagnosis not present

## 2021-10-29 DIAGNOSIS — E782 Mixed hyperlipidemia: Secondary | ICD-10-CM

## 2021-10-29 DIAGNOSIS — Z8719 Personal history of other diseases of the digestive system: Secondary | ICD-10-CM | POA: Diagnosis not present

## 2021-10-29 DIAGNOSIS — Z1159 Encounter for screening for other viral diseases: Secondary | ICD-10-CM

## 2021-10-29 DIAGNOSIS — M810 Age-related osteoporosis without current pathological fracture: Secondary | ICD-10-CM

## 2021-10-29 MED ORDER — ROSUVASTATIN CALCIUM 10 MG PO TABS: 10.0000 mg | ORAL_TABLET | Freq: Every day | ORAL | 3 refills | Status: DC

## 2021-10-29 MED ORDER — PANTOPRAZOLE SODIUM 40 MG PO TBEC: 40.0000 mg | DELAYED_RELEASE_TABLET | Freq: Every day | ORAL | 3 refills | Status: DC

## 2021-10-29 NOTE — Assessment & Plan Note (Signed)
Was on Fosamax ?Advised to take Vitamin D 2000 IU for now ?Had DEXA scan recently, will try to obtain records from OB/GYN ?

## 2021-10-29 NOTE — Patient Instructions (Signed)
Please continue to follow heart heathy diet. ? ?Please get fasting blood tests done in 08/23. ? ?Please contact us if you need help with GI follow up for endoscopy/colonoscopy. ?

## 2021-10-29 NOTE — Assessment & Plan Note (Signed)
Refilled Pantoprazole 

## 2021-10-29 NOTE — Assessment & Plan Note (Signed)
H/o esophageal stricture in the past, s/p balloon dilation ?Has not seen GI for the last 5 years ?Discussed about GI follow up ?

## 2021-10-29 NOTE — Progress Notes (Signed)
Established Patient Office Visit  Subjective:  Patient ID: [name], female    DOB: [birth year]  Age: 66 y.o. MRN: [id number]  CC:  Chief Complaint  Patient presents with   Follow-up    6 month follow up pt has upcoming rotator cuff surgery on April 3     HPI [name] is a 66 y.o. female with past medical history of osteoporosis, GERD, migraine and insomnia who presents for f/u of her chronic medical conditions.  She has been having difficulty swallowing especially with solid food. She states that she has worse dysphagia with certain food, such as rice. She has not contacted GI yet for follow up for dysphagia. Her acid reflux is better with Pantoprazole and requests refills.  She has been taking Crestor for HLD. BP is well-controlled. Patient denies headache, dizziness, chest pain, dyspnea or palpitations.     Past Medical History:  Diagnosis Date   Arthritis    BCC (basal cell carcinoma of skin) 11/22/1993   bcc left nasal bridge tx: curet, exc.   BCC (basal cell carcinoma of skin) 12/20/1993   bcc + margin left nasal bridge tx:exc.   Common migraine 08/01/2014   Diverticulosis    GERD (gastroesophageal reflux disease)    History of hepatitis A    Osteoporosis    SCC (squamous cell carcinoma) 05/11/2002   scc in situ bowens left cheek   Thyroid nodule    per recent US, has enlarged, but being monitored now    Past Surgical History:  Procedure Laterality Date   ABDOMINAL HYSTERECTOMY N/A    [facility] 08/27/2020   BACK SURGERY  12/2006   Lumbosacral spine   COLONOSCOPY     NASAL SINUS SURGERY     SPINE SURGERY N/A    [facility] 08/27/2020   thyroid lumpectomy  1981   TONSILLECTOMY     UPPER GASTROINTESTINAL ENDOSCOPY     VAGINAL HYSTERECTOMY      Family History  Problem Relation Age of Onset   Diabetes Mother    COPD Mother    Hypertension Mother    Heart attack Father    Migraines Father    Migraines Sister    Stomach cancer Maternal  Grandmother    Other Other        brain tumor   Colon cancer Neg Hx    Esophageal cancer Neg Hx    Rectal cancer Neg Hx     Social History   Socioeconomic History   Marital status: Married    Spouse name: Not on file   Number of children: 1   Years of education: Not on file   Highest education level: Not on file  Occupational History   Occupation: retired  Tobacco Use   Smoking status: Former    Types: Cigarettes    Quit date: 08/25/1978    Years since quitting: 43.2   Smokeless tobacco: Never  Substance and Sexual Activity   Alcohol use: Yes    Alcohol/week: 3.0 standard drinks    Types: 3 Standard drinks or equivalent per week    Comment: occ   Drug use: No   Sexual activity: Not on file  Other Topics Concern   Not on file  Social History Narrative   Not on file   Social Determinants of Health   Financial Resource Strain: Low Risk    Difficulty of Paying Living Expenses: Not hard at all  Food Insecurity: No Food Insecurity   Worried About Running  Out of Food in the Last Year: Never true   Ran Out of Food in the Last Year: Never true  Transportation Needs: No Transportation Needs   Lack of Transportation (Medical): No   Lack of Transportation (Non-Medical): No  Physical Activity: Inactive   Days of Exercise per Week: 0 days   Minutes of Exercise per Session: 0 min  Stress: No Stress Concern Present   Feeling of Stress : Not at all  Social Connections: Moderately Integrated   Frequency of Communication with Friends and Family: More than three times a week   Frequency of Social Gatherings with Friends and Family: More than three times a week   Attends Religious Services: More than 4 times per year   Active Member of [employer] or Organizations: No   Attends Archivist Meetings: Never   Marital Status: Married  Human resources officer Violence: Not At Risk   Fear of Current or Ex-Partner: No   Emotionally Abused: No   Physically Abused: No   Sexually Abused: No     Outpatient Medications Prior to Visit  Medication Sig Dispense Refill   escitalopram (LEXAPRO) 5 MG tablet Take 5 mg by mouth daily.     loratadine (CLARITIN) 10 MG tablet Take 10 mg by mouth daily.     meloxicam (MOBIC) 15 MG tablet Take 15 mg by mouth daily.     naratriptan (AMERGE) 2.5 MG tablet Take 1 tablet (2.5 mg total) by mouth 2 (two) times daily. Take one (1) tablet at onset of headache; if returns or does not resolve, may repeat after 4 hours; do not exceed five (5) mg in 24 hours. 10 tablet 3   propranolol (INDERAL) 20 MG tablet One tablet twice a day for 2 weeks, then take 2 tablets twice a day 120 tablet 3   Propylene Glycol-Glycerin ([employer] ARTIFICIAL TEARS) 1-0.3 % SOLN Apply 1 drop to eye daily. 15 mL 0   rizatriptan (MAXALT) 10 MG tablet Take 10 mg by mouth as needed for migraine. May repeat in 2 hours if needed     senna (SENOKOT) 8.6 MG TABS tablet Take 1 tablet (8.6 mg total) by mouth daily as needed for mild constipation. 120 tablet 0   topiramate (TOPAMAX) 25 MG tablet Take 1 tablet (25 mg total) by mouth 2 (two) times daily. 180 tablet 4   zolpidem (AMBIEN) 10 MG tablet Take 10 mg by mouth at bedtime as needed.     pantoprazole (PROTONIX) 40 MG tablet Take 1 tablet (40 mg total) by mouth daily. 90 tablet 3   rosuvastatin (CRESTOR) 10 MG tablet TAKE 1 TABLET BY MOUTH EVERY DAY 90 tablet 1   alendronate (FOSAMAX) 70 MG tablet 70 mg every 7 (seven) days. (Patient not taking: Reported on 10/29/2021)     No facility-administered medications prior to visit.    No Known Allergies  ROS Review of Systems  Constitutional:  Negative for chills and fever.  HENT:  Negative for congestion, sinus pressure, sinus pain and sore throat.   Eyes:  Negative for pain and discharge.       Dry eyes  Respiratory:  Negative for cough and shortness of breath.   Cardiovascular:  Negative for chest pain and palpitations.  Gastrointestinal:  Negative for abdominal pain, constipation,  diarrhea, nausea and vomiting.       Dysphagia  Endocrine: Negative for polydipsia and polyuria.  Genitourinary:  Negative for dysuria and hematuria.  Musculoskeletal:  Negative for neck pain and neck stiffness.  Skin:  Negative for rash.  Neurological:  Negative for dizziness and weakness.  Psychiatric/Behavioral:  Negative for agitation and behavioral problems.      Objective:    Physical Exam Vitals reviewed.  Constitutional:      General: She is not in acute distress.    Appearance: She is not diaphoretic.  HENT:     Head: Normocephalic and atraumatic.     Nose: Nose normal.     Mouth/Throat:     Mouth: Mucous membranes are moist.  Eyes:     General: No scleral icterus.    Extraocular Movements: Extraocular movements intact.  Cardiovascular:     Rate and Rhythm: Normal rate and regular rhythm.     Pulses: Normal pulses.     Heart sounds: Normal heart sounds. No murmur heard. Pulmonary:     Breath sounds: Normal breath sounds. No wheezing or rales.  Abdominal:     Palpations: Abdomen is soft.     Tenderness: There is no abdominal tenderness.  Musculoskeletal:     Cervical back: Neck supple. No tenderness.     Right lower leg: No edema.     Left lower leg: No edema.  Skin:    General: Skin is warm.     Findings: No rash.     Comments: 2 soft tissue mass over right thigh, lipoma-like, around 0.5 cm in diameter each. No warmth or erythema  Neurological:     General: No focal deficit present.     Mental Status: She is alert and oriented to person, place, and time.  Psychiatric:        Mood and Affect: Mood normal.        Behavior: Behavior normal.    BP 124/72 (BP Location: Left Arm, Patient Position: Sitting, Cuff Size: Normal)    Pulse 68    Resp 18    Ht 5' 7.5" (1.715 m)    Wt 167 lb 3.2 oz (75.8 kg)    SpO2 100%    BMI 25.80 kg/m  Wt Readings from Last 3 Encounters:  10/29/21 167 lb 3.2 oz (75.8 kg)  05/01/21 159 lb 1.3 oz (72.2 kg)  04/17/21 160 lb (72.6  kg)    Lab Results  Component Value Date   TSH 1.680 08/28/2020   Lab Results  Component Value Date   WBC 5.4 08/28/2020   HGB 13.5 08/28/2020   HCT 39.8 08/28/2020   MCV 89 08/28/2020   PLT 292 08/28/2020   Lab Results  Component Value Date   NA 144 04/18/2021   K 4.6 04/18/2021   CO2 21 04/18/2021   GLUCOSE 89 04/18/2021   BUN 17 04/18/2021   CREATININE 0.99 04/18/2021   BILITOT 0.3 08/28/2020   ALKPHOS 67 08/28/2020   AST 14 08/28/2020   ALT 10 08/28/2020   PROT 6.8 08/28/2020   ALBUMIN 4.7 08/28/2020   CALCIUM 9.2 04/18/2021   EGFR 63 04/18/2021   Lab Results  Component Value Date   CHOL 165 04/18/2021   Lab Results  Component Value Date   HDL 58 04/18/2021   Lab Results  Component Value Date   LDLCALC 94 04/18/2021   Lab Results  Component Value Date   TRIG 68 04/18/2021   Lab Results  Component Value Date   CHOLHDL 2.8 04/18/2021   Lab Results  Component Value Date   HGBA1C 5.3 08/28/2020      Assessment & Plan:   Problem List Items Addressed This Visit  Digestive   Gastroesophageal reflux disease    Refilled Pantoprazole      Relevant Medications   pantoprazole (PROTONIX) 40 MG tablet   Dysphagia - Primary    H/o esophageal stricture in the past, s/p balloon dilation Has not seen GI for the last 5 years Discussed about GI follow up        Musculoskeletal and Integument   Osteoporosis    Was on Fosamax Advised to take Vitamin D 2000 IU for now Had DEXA scan recently, will try to obtain records from OB/GYN        Other   History of esophageal stricture    Her dysphagia could be due to esophageal stricture Needs to follow up with GI      Insomnia    Takes Ambien 5 mg PRN      Mixed hyperlipidemia    Lipid profile improved with Crestor      Relevant Medications   rosuvastatin (CRESTOR) 10 MG tablet   Other Relevant Orders   Lipid Profile   Other Visit Diagnoses     Need for hepatitis C screening test        Relevant Orders   Hepatitis C Antibody   Meds ordered this encounter  Medications   pantoprazole (PROTONIX) 40 MG tablet    Sig: Take 1 tablet (40 mg total) by mouth daily.    Dispense:  90 tablet    Refill:  3   rosuvastatin (CRESTOR) 10 MG tablet    Sig: Take 1 tablet (10 mg total) by mouth daily.    Dispense:  90 tablet    Refill:  3    Follow-up: Return in about 1 year (around 10/30/2022) for HLD and dysphagia.    Lindell Spar, MD

## 2021-10-29 NOTE — Assessment & Plan Note (Signed)
Takes Ambien 5 mg PRN ?

## 2021-10-29 NOTE — Assessment & Plan Note (Signed)
Lipid profile improved with Crestor 

## 2021-10-29 NOTE — Assessment & Plan Note (Signed)
Her dysphagia could be due to esophageal stricture ?Needs to follow up with GI ?

## 2021-11-05 ENCOUNTER — Other Ambulatory Visit: Payer: Medicare Other

## 2021-11-05 ENCOUNTER — Encounter: Payer: Self-pay | Admitting: *Deleted

## 2021-11-11 ENCOUNTER — Ambulatory Visit
Admission: RE | Admit: 2021-11-11 | Discharge: 2021-11-11 | Disposition: A | Discharge status: Home / Self Care | Payer: No Typology Code available for payment source | Source: Ambulatory Visit | Attending: Obstetrics and Gynecology | Admitting: Obstetrics and Gynecology

## 2021-11-11 DIAGNOSIS — Z8249 Family history of ischemic heart disease and other diseases of the circulatory system: Secondary | ICD-10-CM

## 2021-11-25 HISTORY — PX: OTHER SURGICAL HISTORY: SHX169

## 2022-01-30 ENCOUNTER — Telehealth: Payer: Self-pay | Admitting: Neurology

## 2022-01-30 NOTE — Telephone Encounter (Signed)
LVM and mychart msg for r/s needed 08/10 - sarah out

## 2022-02-07 ENCOUNTER — Encounter: Payer: Self-pay | Admitting: Internal Medicine

## 2022-02-18 ENCOUNTER — Encounter: Payer: Self-pay | Admitting: Internal Medicine

## 2022-03-04 ENCOUNTER — Ambulatory Visit (AMBULATORY_SURGERY_CENTER): Payer: Self-pay

## 2022-03-04 VITALS — Ht 67.5 in | Wt 168.0 lb

## 2022-03-04 DIAGNOSIS — Z1211 Encounter for screening for malignant neoplasm of colon: Secondary | ICD-10-CM

## 2022-03-04 MED ORDER — NA SULFATE-K SULFATE-MG SULF 17.5-3.13-1.6 GM/177ML PO SOLN: 1.0000 | Freq: Once | ORAL | 0 refills | Status: AC

## 2022-03-04 NOTE — Progress Notes (Signed)
No egg or soy allergy known to patient  No issues known to pt with past sedation with any surgeries or procedures Patient denies ever being told they had issues or difficulty with intubation  No FH of Malignant Hyperthermia Pt is not on diet pills Pt is not on home 02  Pt is not on blood thinners  Pt denies issues with constipation  No A fib or A flutter Have any cardiac testing pending--NO Pt instructed to use [email or web address] or GoodRx for a price reduction on prep  Patient's husband present for PV appt; Patient given SingleCare card at [facility] appt;

## 2022-03-17 NOTE — Progress Notes (Unsigned)
Patient: [name] Date of Birth: 1956/01/30  Reason for Visit: Follow up History from: Patient Primary Neurologist: Dr. Billey Gosling   ASSESSMENT AND PLAN 66 y.o. year old female    HISTORY OF PRESENT ILLNESS: Today 03/17/22  HISTORY  Update 04/17/2021 SS: [name] is a 66 year old right-handed white female with a history of common migraine headache.  The patient has been on Topamax for a number of years, in the past she was increased to a dose of 100 mg daily but could not tolerate it secondary to cognitive side effects.  She has reduced the dose to 25 mg twice daily but still has some mild cognitive side effects and she still has at the least 10 headache days a month.  She may go a week or 2 without any headache but then when she does get a headache they will last 5 to 7 days.  The patient is able to function through the headache.  She denies any nausea or vomiting.  She indicates that weather changes are oftentimes an activator for her headache.  She currently is retired.  She takes Maxalt when she gets the headache, but it tends to wear off and the headache comes back.  She is also being followed for problems with osteoporosis, gastroesophageal reflux disease, and chronic insomnia.  REVIEW OF SYSTEMS: Out of a complete 14 system review of symptoms, the patient complains only of the following symptoms, and all other reviewed systems are negative.  See HPI  ALLERGIES: No Known Allergies  HOME MEDICATIONS: Outpatient Medications Prior to Visit  Medication Sig Dispense Refill   Fiber POWD Take 1 Dose by mouth daily at 2 am. Prebiotic fiber supplement     gabapentin (NEURONTIN) 100 MG capsule Take 100 mg by mouth 3 (three) times daily.     loratadine (CLARITIN) 10 MG tablet Take 10 mg by mouth daily.     Multiple Vitamins-Minerals (CENTRUM SILVER 50+WOMEN) TABS Take 1 tablet by mouth daily at 6 (six) AM.     pantoprazole (PROTONIX) 40 MG tablet Take 1 tablet (40 mg total) by  mouth daily. 90 tablet 3   Propylene Glycol-Glycerin ([employer] ARTIFICIAL TEARS) 1-0.3 % SOLN Apply 1 drop to eye daily. (Patient taking differently: Apply 1 drop to eye daily as needed.) 15 mL 0   rizatriptan (MAXALT) 10 MG tablet Take 10 mg by mouth as needed for migraine. May repeat in 2 hours if needed     rosuvastatin (CRESTOR) 10 MG tablet Take 1 tablet (10 mg total) by mouth daily. 90 tablet 3   zolpidem (AMBIEN) 10 MG tablet Take 10 mg by mouth at bedtime as needed.     No facility-administered medications prior to visit.    PAST MEDICAL HISTORY: Past Medical History:  Diagnosis Date   Anxiety    Arthritis    BCC (basal cell carcinoma of skin) 11/22/1993   bcc left nasal bridge tx: curet, exc.   BCC (basal cell carcinoma of skin) 12/20/1993   bcc + margin left nasal bridge tx:exc.   Common migraine 08/01/2014   Depression    Diverticulosis    GERD (gastroesophageal reflux disease)    on meds   History of hepatitis A    Osteoporosis    SCC (squamous cell carcinoma) 05/11/2002   scc in situ bowens left cheek   Seasonal allergies    Thyroid nodule    per recent US, has enlarged, but being monitored now    PAST SURGICAL HISTORY: Past  Surgical History:  Procedure Laterality Date   ABDOMINAL HYSTERECTOMY N/A    [facility]ia 08/27/2020   BACK SURGERY  12/2006   Lumbosacral spine   COLONOSCOPY     NASAL SINUS SURGERY     SPINE SURGERY N/A    [facility]ia 08/27/2020   thyroid lumpectomy  1981   TONSILLECTOMY     UPPER GASTROINTESTINAL ENDOSCOPY     VAGINAL HYSTERECTOMY      FAMILY HISTORY: Family History  Problem Relation Age of Onset   Diabetes Mother    COPD Mother    Hypertension Mother    Heart attack Father    Migraines Father    Migraines Sister    Stomach cancer Maternal Grandmother    Other Other        brain tumor   Colon cancer Neg Hx    Esophageal cancer Neg Hx    Rectal cancer Neg Hx    Colon polyps Neg Hx     SOCIAL HISTORY: Social History    Socioeconomic History   Marital status: Married    Spouse name: Not on file   Number of children: 1   Years of education: Not on file   Highest education level: Not on file  Occupational History   Occupation: retired  Tobacco Use   Smoking status: Former    Types: Cigarettes    Quit date: 08/25/1978    Years since quitting: 43.5   Smokeless tobacco: Never  Vaping Use   Vaping Use: Never used  Substance and Sexual Activity   Alcohol use: Not Currently    Alcohol/week: 0.0 - 2.0 standard drinks of alcohol   Drug use: No   Sexual activity: Not on file  Other Topics Concern   Not on file  Social History Narrative   Not on file   Social Determinants of Health   Financial Resource Strain: Low Risk  (05/01/2021)   Overall Financial Resource Strain (CARDIA)    Difficulty of Paying Living Expenses: Not hard at all  Food Insecurity: No Food Insecurity (05/01/2021)   Hunger Vital Sign    Worried About Running Out of Food in the Last Year: Never true    [place] in the Last Year: Never true  Transportation Needs: No Transportation Needs (05/01/2021)   PRAPARE - Hydrologist (Medical): No    Lack of Transportation (Non-Medical): No  Physical Activity: Inactive (05/01/2021)   Exercise Vital Sign    Days of Exercise per Week: 0 days    Minutes of Exercise per Session: 0 min  Stress: No Stress Concern Present (05/01/2021)   [place]    Feeling of Stress : Not at all  Social Connections: Moderately Integrated (05/01/2021)   Social Connection and Isolation Panel [NHANES]    Frequency of Communication with Friends and Family: More than three times a week    Frequency of Social Gatherings with Friends and Family: More than three times a week    Attends Religious Services: More than 4 times per year    Active Member of [employer] or Organizations: No    Attends Archivist Meetings: Never     Marital Status: Married  Human resources officer Violence: Not At Risk (05/01/2021)   Humiliation, Afraid, Rape, and Kick questionnaire    Fear of Current or Ex-Partner: No    Emotionally Abused: No    Physically Abused: No    Sexually Abused: No  PHYSICAL EXAM  There were no vitals filed for this visit. There is no height or weight on file to calculate BMI.  Generalized: Well developed, in no acute distress  Neurological examination  Mentation: Alert oriented to time, place, history taking. Follows all commands speech and language fluent Cranial nerve II-XII: Pupils were equal round reactive to light. Extraocular movements were full, visual field were full on confrontational test. Facial sensation and strength were normal. Uvula tongue midline. Head turning and shoulder shrug  were normal and symmetric. Motor: The motor testing reveals 5 over 5 strength of all 4 extremities. Good symmetric motor tone is noted throughout.  Sensory: Sensory testing is intact to soft touch on all 4 extremities. No evidence of extinction is noted.  Coordination: Cerebellar testing reveals good finger-nose-finger and heel-to-shin bilaterally.  Gait and station: Gait is normal. Tandem gait is normal. Romberg is negative. No drift is seen.  Reflexes: Deep tendon reflexes are symmetric and normal bilaterally.   DIAGNOSTIC DATA (LABS, IMAGING, TESTING) - I reviewed patient records, labs, notes, testing and imaging myself where available.  Lab Results  Component Value Date   WBC 5.4 08/28/2020   HGB 13.5 08/28/2020   HCT 39.8 08/28/2020   MCV 89 08/28/2020   PLT 292 08/28/2020      Component Value Date/Time   NA 144 04/18/2021 0835   K 4.6 04/18/2021 0835   CL 108 (H) 04/18/2021 0835   CO2 21 04/18/2021 0835   GLUCOSE 89 04/18/2021 0835   GLUCOSE 108 (H) 02/25/2007 0320   BUN 17 04/18/2021 0835   CREATININE 0.99 04/18/2021 0835   CALCIUM 9.2 04/18/2021 0835   PROT 6.8 08/28/2020 0914   ALBUMIN 4.7  08/28/2020 0914   AST 14 08/28/2020 0914   ALT 10 08/28/2020 0914   ALKPHOS 67 08/28/2020 0914   BILITOT 0.3 08/28/2020 0914   GFRNONAA 61 08/28/2020 0914   GFRAA 70 08/28/2020 0914   Lab Results  Component Value Date   CHOL 165 04/18/2021   HDL 58 04/18/2021   LDLCALC 94 04/18/2021   TRIG 68 04/18/2021   CHOLHDL 2.8 04/18/2021   Lab Results  Component Value Date   HGBA1C 5.3 08/28/2020   No results found for: "VITAMINB12" Lab Results  Component Value Date   TSH 1.680 08/28/2020    [name], AGNP-C, DNP 03/17/2022, 4:34 PM [facility] [address] [place], [place] [address] [phone]

## 2022-03-18 ENCOUNTER — Telehealth: Payer: Self-pay

## 2022-03-18 ENCOUNTER — Encounter: Payer: Self-pay | Admitting: Neurology

## 2022-03-18 ENCOUNTER — Ambulatory Visit: Payer: Medicare Other | Admitting: Neurology

## 2022-03-18 VITALS — BP 130/84 | HR 63 | Ht 67.5 in | Wt 165.5 lb

## 2022-03-18 DIAGNOSIS — G43009 Migraine without aura, not intractable, without status migrainosus: Secondary | ICD-10-CM

## 2022-03-18 MED ORDER — UBRELVY 100 MG PO TABS
100.0000 mg | ORAL_TABLET | ORAL | 11 refills | Status: DC | PRN
Start: 2022-03-18 — End: 2022-04-11

## 2022-03-18 NOTE — Telephone Encounter (Signed)
Ubrelvy 100 MG tablets require a PA. A PA has been started on CMM. Key #: [id number]. Awaiting authorization.

## 2022-03-18 NOTE — Patient Instructions (Signed)
Try [name] at onset of headache for relief, may repeat once 2 hours later  Meds ordered this encounter  Medications   Ubrogepant (UBRELVY) 100 MG TABS    Sig: Take 100 mg by mouth as needed (Take 1 tablet at onset of headache, may repeat 2 hours later, max is 200 mg in 24 hours).    Dispense:  10 tablet    Refill:  11   Please consider Ajovy or Emgality for migraine prevention

## 2022-03-19 ENCOUNTER — Other Ambulatory Visit: Payer: Self-pay

## 2022-03-19 MED ORDER — NURTEC 75 MG PO TBDP: 1.0000 | ORAL_TABLET | ORAL | 0 refills | Status: DC | PRN

## 2022-03-19 NOTE — Telephone Encounter (Signed)
Request Reference Number: [id number]. UBRELVY TAB '100MG'$  is denied for not meeting the prior authorization requirement(s).

## 2022-03-19 NOTE — Progress Notes (Signed)
[name] has been denied by insurance. [name], NP provided a verbal order for Nurtec 75 MG ODT. Rx has been sent to pharmacy.

## 2022-03-20 ENCOUNTER — Telehealth: Payer: Self-pay

## 2022-03-20 NOTE — Telephone Encounter (Signed)
Nurtec was denied by insurance. A prior authorization has been started on CMM.  Key #[id number]. It is awaiting authorization.

## 2022-03-20 NOTE — Telephone Encounter (Signed)
Request Reference Number: [id number]. NURTEC TAB '75MG'$  ODT is approved through 08/24/2022. Your patient may now fill this prescription and it will be covered. PA Case ID: [id number] - Rx #: [id number].  I left a VM on the patient's phone (as per DPR) to inform the patient the PA has been approved and the medication is ready to pick up at the pharmacy.

## 2022-03-25 ENCOUNTER — Encounter: Payer: Medicare Other | Admitting: Internal Medicine

## 2022-04-03 ENCOUNTER — Ambulatory Visit: Payer: Medicare Other | Admitting: Neurology

## 2022-04-03 ENCOUNTER — Encounter: Payer: Self-pay | Admitting: Internal Medicine

## 2022-04-03 ENCOUNTER — Other Ambulatory Visit: Payer: Self-pay | Admitting: Orthopaedic Surgery

## 2022-04-03 DIAGNOSIS — M4302 Spondylolysis, cervical region: Secondary | ICD-10-CM

## 2022-04-05 ENCOUNTER — Encounter: Payer: Self-pay | Admitting: Certified Registered Nurse Anesthetist

## 2022-04-11 ENCOUNTER — Encounter: Payer: Self-pay | Admitting: Internal Medicine

## 2022-04-11 ENCOUNTER — Ambulatory Visit (AMBULATORY_SURGERY_CENTER): Payer: Medicare Other | Admitting: Internal Medicine

## 2022-04-11 VITALS — BP 141/91 | HR 62 | Temp 97.3°F | Resp 17 | Ht 67.5 in | Wt 168.0 lb

## 2022-04-11 DIAGNOSIS — D122 Benign neoplasm of ascending colon: Secondary | ICD-10-CM | POA: Diagnosis not present

## 2022-04-11 DIAGNOSIS — D120 Benign neoplasm of cecum: Secondary | ICD-10-CM | POA: Diagnosis not present

## 2022-04-11 DIAGNOSIS — Z1211 Encounter for screening for malignant neoplasm of colon: Secondary | ICD-10-CM

## 2022-04-11 MED ORDER — SODIUM CHLORIDE 0.9 % IV SOLN: 500.0000 mL | Freq: Once | INTRAVENOUS | Status: DC

## 2022-04-11 NOTE — Progress Notes (Signed)
Pt's states no medical or surgical changes since previsit or office visit. 

## 2022-04-11 NOTE — Patient Instructions (Addendum)
I found and removed 2 very tiny polyps. You also have a condition called diverticulosis - common and not usually a problem. Please read the handout provided.  I will let you know pathology results and when to have another routine colonoscopy by mail and/or My Chart.  We will schedule an upper endoscopy and possible esophageal dilation to evaluate and treat dysphagia (swallowing problems).  I appreciate the opportunity to care for you. Gatha Mayer, MD, FACG  YOU HAD AN ENDOSCOPIC PROCEDURE TODAY AT [facility]:   Refer to the procedure report that was given to you for any specific questions about what was found during the examination.  If the procedure report does not answer your questions, please call your gastroenterologist to clarify.  If you requested that your care partner not be given the details of your procedure findings, then the procedure report has been included in a sealed envelope for you to review at your convenience later.  YOU SHOULD EXPECT: Some feelings of bloating in the abdomen. Passage of more gas than usual.  Walking can help get rid of the air that was put into your GI tract during the procedure and reduce the bloating. If you had a lower endoscopy (such as a colonoscopy or flexible sigmoidoscopy) you may notice spotting of blood in your stool or on the toilet paper. If you underwent a bowel prep for your procedure, you may not have a normal bowel movement for a few days.  Please Note:  You might notice some irritation and congestion in your nose or some drainage.  This is from the oxygen used during your procedure.  There is no need for concern and it should clear up in a day or so.  SYMPTOMS TO REPORT IMMEDIATELY:  Following lower endoscopy (colonoscopy or flexible sigmoidoscopy):  Excessive amounts of blood in the stool  Significant tenderness or worsening of abdominal pains  Swelling of the abdomen that is new, acute  Fever of 100F or  higher  Following upper endoscopy (EGD)  Vomiting of blood or coffee ground material  New chest pain or pain under the shoulder blades  Painful or persistently difficult swallowing  New shortness of breath  Fever of 100F or higher  Black, tarry-looking stools  For urgent or emergent issues, a gastroenterologist can be reached at any hour by calling [phone]. Do not use MyChart messaging for urgent concerns.    DIET:  We do recommend a small meal at first, but then you may proceed to your regular diet.  Drink plenty of fluids but you should avoid alcoholic beverages for 24 hours.  ACTIVITY:  You should plan to take it easy for the rest of today and you should NOT DRIVE or use heavy machinery until tomorrow (because of the sedation medicines used during the test).    FOLLOW UP: Our staff will call the number listed on your records the next business day following your procedure.  We will call around 7:15- 8:00 am to check on you and address any questions or concerns that you may have regarding the information given to you following your procedure. If we do not reach you, we will leave a message.  If you develop any symptoms (ie: fever, flu-like symptoms, shortness of breath, cough etc.) before then, please call [phone].  If you test positive for Covid 19 in the 2 weeks post procedure, please call and report this information to [place].    If any biopsies were taken you will  be contacted by phone or by letter within the next 1-3 weeks.  Please call us at [phone] if you have not heard about the biopsies in 3 weeks.    SIGNATURES/CONFIDENTIALITY: You and/or your care partner have signed paperwork which will be entered into your electronic medical record.  These signatures attest to the fact that that the information above on your After Visit Summary has been reviewed and is understood.  Full responsibility of the confidentiality of this discharge information lies with you and/or  your care-partner.

## 2022-04-11 NOTE — Progress Notes (Signed)
Report given to PACU, vss 

## 2022-04-11 NOTE — Progress Notes (Signed)
[facility] History and Physical   Primary Care Physician:  Lindell Spar, MD   Reason for Procedure:   CRCA screening  Plan:    colonoscopy     HPI: [name] is a 66 y.o. female for screening exam  Also having recurrent solid dysphagia  Past Medical History:  Diagnosis Date   Anxiety    Arthritis    BCC (basal cell carcinoma of skin) 11/22/1993   bcc left nasal bridge tx: curet, exc.   BCC (basal cell carcinoma of skin) 12/20/1993   bcc + margin left nasal bridge tx:exc.   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 Authorizing Provider  Fiber POWD Take 1 Dose by mouth daily at 2 am. Prebiotic fiber supplement   Yes [provider]  loratadine (CLARITIN) 10 MG tablet Take 10 mg by mouth daily.   Yes [provider]  Multiple Vitamins-Minerals (CENTRUM SILVER 50+WOMEN) TABS Take 1 tablet by mouth daily at 6 (six) AM.   Yes [provider]  pantoprazole (PROTONIX) 40 MG tablet Take 1 tablet (40 mg total) by mouth daily. 10/29/21  Yes Lindell Spar, MD  rizatriptan (MAXALT) 10 MG tablet Take 10 mg by mouth as needed for migraine. May repeat in 2 hours if needed   Yes [provider]  rosuvastatin (CRESTOR) 10 MG tablet Take 1 tablet (10 mg total) by mouth daily. 10/29/21  Yes Lindell Spar, MD  zolpidem (AMBIEN) 10 MG tablet Take 10 mg by mouth at bedtime as needed. 02/21/20  Yes [provider]  Propylene Glycol-Glycerin ([employer]S ARTIFICIAL TEARS) 1-0.3 % SOLN Apply 1 drop to eye daily. Patient taking differently: Apply 1 drop to eye daily as needed. 08/28/20   Lindell Spar, MD  Rimegepant Sulfate (NURTEC) 75 MG TBDP Take 1 tablet by mouth as needed. Take 1 tablet daily as needed for migraine. This is a 30 day supply. 03/19/22   Suzzanne Cloud, NP  Ubrogepant (UBRELVY) 100 MG TABS Take 100 mg by mouth as needed (Take 1 tablet at onset of headache, may repeat 2 hours later, max is 200 mg in 24 hours). 03/18/22   Suzzanne Cloud, NP    Current Outpatient Medications  Medication Sig Dispense Refill   Fiber POWD Take 1 Dose by mouth daily at 2 am. Prebiotic fiber supplement     loratadine (CLARITIN) 10 MG tablet Take 10 mg by mouth daily.     Multiple Vitamins-Minerals (CENTRUM SILVER 50+WOMEN) TABS Take 1 tablet by mouth daily at 6 (six) AM.  pantoprazole (PROTONIX) 40 MG tablet Take 1 tablet (40 mg total) by mouth daily. 90 tablet 3   rizatriptan (MAXALT) 10 MG tablet Take 10 mg by mouth as needed for migraine. May repeat in 2 hours if needed     rosuvastatin (CRESTOR) 10 MG tablet Take 1 tablet (10 mg total) by mouth daily. 90 tablet 3   zolpidem (AMBIEN) 10 MG tablet Take 10 mg by mouth at bedtime as needed.     Propylene Glycol-Glycerin (CVS ARTIFICIAL TEARS) 1-0.3 % SOLN Apply 1 drop to eye daily. (Patient taking differently: Apply 1 drop to eye daily as needed.) 15 mL 0   Rimegepant Sulfate (NURTEC) 75 MG TBDP Take 1 tablet by mouth as needed. Take 1 tablet daily as needed for migraine. This is a 30 day supply. 8 tablet 0   Ubrogepant (UBRELVY) 100 MG TABS Take 100 mg by mouth as needed (Take 1 tablet at onset of headache, may repeat 2 hours later,  max is 200 mg in 24 hours). 10 tablet 11   Current Facility-Administered Medications  Medication Dose Route Frequency Provider Last Rate Last Admin   0.9 %  sodium chloride infusion  500 mL Intravenous Once Gatha Mayer, MD        Allergies as of 04/11/2022   (No Known Allergies)    Family History  Problem Relation Age of Onset   Diabetes Mother    COPD Mother    Hypertension Mother    Heart attack Father    Migraines Father    Migraines Sister    Stomach cancer Maternal Grandmother    Other Other        brain tumor   Colon cancer Neg Hx    Esophageal cancer Neg Hx    Rectal cancer Neg Hx    Colon polyps Neg Hx     Social History   Socioeconomic History   Marital status: Married    Spouse name: Not on file   Number of children: 1   Years of education: Not on file   Highest education level: Not on file  Occupational History   Occupation: retired  Tobacco Use   Smoking status: Former    Types: Cigarettes    Quit date: 08/25/1978    Years since quitting: 43.6   Smokeless tobacco: Never  Vaping Use   Vaping Use: Never used  Substance and Sexual Activity   Alcohol use: Not Currently    Alcohol/week: 0.0 - 2.0 standard drinks of alcohol   Drug use: No   Sexual activity: Not on file  Other Topics Concern   Not on file  Social History Narrative   Not on file   Social Determinants of Health   Financial Resource Strain: Low Risk  (05/01/2021)   Overall Financial Resource Strain (CARDIA)    Difficulty of Paying Living Expenses: Not hard at all  Food Insecurity: No Food Insecurity (05/01/2021)   Hunger Vital Sign    Worried About Running Out of Food in the Last Year: Never true    [place] in the Last Year: Never true  Transportation Needs: No Transportation Needs (05/01/2021)   PRAPARE - Hydrologist (Medical): No    Lack of Transportation (Non-Medical): No  Physical Activity: Inactive (05/01/2021)   Exercise Vital Sign    Days  of Exercise per Week: 0 days    Minutes of Exercise per Session: 0 min  Stress: No Stress Concern Present (05/01/2021)  [employer] - Occupational Stress Questionnaire    Feeling of Stress : Not at all  Social Connections: Moderately Integrated (05/01/2021)   Social Connection and Isolation Panel [NHANES]    Frequency of Communication with Friends and Family: More than three times a week    Frequency of Social Gatherings with Friends and Family: More than three times a week    Attends Religious Services: More than 4 times per year    Active Member of [employer] or Organizations: No    Attends Archivist Meetings: Never    Marital Status: Married  Human resources officer Violence: Not At Risk (05/01/2021)   Humiliation, Afraid, Rape, and Kick questionnaire    Fear of Current or Ex-Partner: No    Emotionally Abused: No    Physically Abused: No    Sexually Abused: No    Review of Systems:  All other review of systems negative except as mentioned in the HPI.  Physical Exam: Vital signs BP (!) 144/91   Pulse 68   Temp (!) 97.3 F (36.3 C) (Temporal)   Ht 5' 7.5" (1.715 m)   Wt 168 lb (76.2 kg)   SpO2 100%   BMI 25.92 kg/m   General:   Alert,  Well-developed, well-nourished, pleasant and cooperative in NAD Lungs:  Clear throughout to auscultation.   Heart:  Regular rate and rhythm; no murmurs, clicks, rubs,  or gallops. Abdomen:  Soft, nontender and nondistended. Normal bowel sounds.   Neuro/Psych:  Alert and cooperative. Normal mood and affect. A and O x 3   '@Tiphani Mells'$  Simonne Maffucci, MD, [facility] [phone] (pager) 04/11/2022 10:06 AM@

## 2022-04-11 NOTE — Progress Notes (Signed)
Called to room to assist during endoscopic procedure.  Patient ID and intended procedure confirmed with present staff. Received instructions for my participation in the procedure from the performing physician.  

## 2022-04-11 NOTE — Op Note (Signed)
[place] Patient Name: [name] Procedure Date: 04/11/2022 10:10 AM MRN: [id number] Endoscopist: Gatha Mayer , MD Age: 66 Referring MD:  Date of Birth: October 02, 1955 Gender: Female Account #: [id number] Procedure:                Colonoscopy Indications:              Screening for colorectal malignant neoplasm, Last                            colonoscopy: 2013 Medicines:                Monitored Anesthesia Care Procedure:                Pre-Anesthesia Assessment:                           - Prior to the procedure, a History and Physical                            was performed, and patient medications and                            allergies were reviewed. The patient's tolerance of                            previous anesthesia was also reviewed. The risks                            and benefits of the procedure and the sedation                            options and risks were discussed with the patient.                            All questions were answered, and informed consent                            was obtained. Prior Anticoagulants: The patient has                            taken no previous anticoagulant or antiplatelet                            agents. ASA Grade Assessment: II - A patient with                            mild systemic disease. After reviewing the risks                            and benefits, the patient was deemed in                            satisfactory condition to undergo the procedure.  After obtaining informed consent, the colonoscope                            was passed under direct vision. Throughout the                            procedure, the patient's blood pressure, pulse, and                            oxygen saturations were monitored continuously. The                            CF [id number] #[id number] was introduced through the anus                            and advanced to the the cecum,  identified by                            appendiceal orifice and ileocecal valve. The                            colonoscopy was performed without difficulty. The                            patient tolerated the procedure well. The quality                            of the bowel preparation was excellent. The                            ileocecal valve, appendiceal orifice, and rectum                            were photographed. The bowel preparation used was                            SUPREP via split dose instruction. Scope In: 10:15:40 AM Scope Out: 10:30:22 AM Scope Withdrawal Time: 0 hours 12 minutes 28 seconds  Total Procedure Duration: 0 hours 14 minutes 42 seconds  Findings:                 The perianal and digital rectal examinations were                            normal.                           Two sessile polyps were found in the ascending                            colon and cecum. The polyps were 1 to 2 mm in size.                            These polyps were removed with a cold biopsy  forceps. Resection and retrieval were complete.                            Verification of patient identification for the                            specimen was done. Estimated blood loss was minimal.                           Multiple diverticula were found in the sigmoid                            colon.                           The exam was otherwise without abnormality on                            direct and retroflexion views. Complications:            No immediate complications. Estimated Blood Loss:     Estimated blood loss was minimal. Impression:               - Two 1 to 2 mm polyps in the ascending colon and                            in the cecum, removed with a cold biopsy forceps.                            Resected and retrieved.                           - Diverticulosis in the sigmoid colon.                           - The examination was  otherwise normal on direct                            and retroflexion views. Recommendation:           - Patient has a contact number available for                            emergencies. The signs and symptoms of potential                            delayed complications were discussed with the                            patient. Return to normal activities tomorrow.                            Written discharge instructions were provided to the                            patient.                           -  Resume previous diet.                           - Continue present medications.                           - Await pathology results.                           - Repeat colonoscopy is recommended. The                            colonoscopy date will be determined after pathology                            results from today's exam become available for                            review.                           - SCHEDULE DIRECT EGD TO EVALUATE ANT TREAT                            RECURRENT DYSPHAGIA - HX ESOPHAGEAL STRICTURE Gatha Mayer, MD 04/11/2022 10:37:06 AM This report has been signed electronically.

## 2022-04-14 ENCOUNTER — Ambulatory Visit: Payer: Medicare Other | Admitting: *Deleted

## 2022-04-14 ENCOUNTER — Telehealth: Payer: Self-pay | Admitting: *Deleted

## 2022-04-14 VITALS — Ht 67.5 in | Wt 168.0 lb

## 2022-04-14 DIAGNOSIS — R1310 Dysphagia, unspecified: Secondary | ICD-10-CM

## 2022-04-14 NOTE — Telephone Encounter (Signed)
Attempt for follow up phone call. No answer at number given.  No opportunity to leave message on voicemail.

## 2022-04-14 NOTE — Progress Notes (Signed)
Pt's previsit is done over the phone and all paperwork (prep instructions, blank consent form to just read over) sent to patient.  Pt's name and DOB verified at the beginning of the previsit.  Pt denies any difficulty with ambulating.    No trouble with anesthesia, denies being told they were difficult to intubate, or hx/fam hx of malignant hyperthermia per pt   No egg or soy allergy  No home oxygen use   No medications for weight loss taken  

## 2022-04-15 ENCOUNTER — Encounter: Payer: Self-pay | Admitting: Internal Medicine

## 2022-04-15 LAB — LIPID PANEL
Chol/HDL Ratio: 2.7 ratio (ref 0.0–4.4)
Cholesterol, Total: 190 mg/dL (ref 100–199)
HDL: 70 mg/dL (ref >39)
LDL Chol Calc (NIH): 106 mg/dL — ABNORMAL HIGH (ref 0–99)
Triglycerides: 79 mg/dL (ref 0–149)
VLDL Cholesterol Cal: 14 mg/dL (ref 5–40)

## 2022-04-15 LAB — CMP14+EGFR
ALT: 11 IU/L (ref 0–32)
AST: 20 IU/L (ref 0–40)
Albumin/Globulin Ratio: 2.2 (ref 1.2–2.2)
Albumin: 4.7 g/dL (ref 3.9–4.9)
Alkaline Phosphatase: 71 IU/L (ref 44–121)
BUN/Creatinine Ratio: 21 (ref 12–28)
BUN: 19 mg/dL (ref 8–27)
Bilirubin Total: 0.4 mg/dL (ref 0.0–1.2)
CO2: 25 mmol/L (ref 20–29)
Calcium: 10 mg/dL (ref 8.7–10.3)
Chloride: 105 mmol/L (ref 96–106)
Creatinine, Ser: 0.91 mg/dL (ref 0.57–1.00)
Globulin, Total: 2.1 g/dL (ref 1.5–4.5)
Glucose: 88 mg/dL (ref 70–99)
Potassium: 4.8 mmol/L (ref 3.5–5.2)
Sodium: 143 mmol/L (ref 134–144)
Total Protein: 6.8 g/dL (ref 6.0–8.5)
eGFR: 70 mL/min/{1.73_m2} (ref >59)

## 2022-04-15 LAB — CBC WITH DIFFERENTIAL/PLATELET
Basophils Absolute: 0.1 10*3/uL (ref 0.0–0.2)
Basos: 1 %
EOS (ABSOLUTE): 0.2 10*3/uL (ref 0.0–0.4)
Eos: 3 %
Hematocrit: 39.6 % (ref 34.0–46.6)
Hemoglobin: 13.2 g/dL (ref 11.1–15.9)
Immature Grans (Abs): 0 10*3/uL (ref 0.0–0.1)
Immature Granulocytes: 0 %
Lymphocytes Absolute: 2.1 10*3/uL (ref 0.7–3.1)
Lymphs: 36 %
MCH: 29.9 pg (ref 26.6–33.0)
MCHC: 33.3 g/dL (ref 31.5–35.7)
MCV: 90 fL (ref 79–97)
Monocytes Absolute: 0.5 10*3/uL (ref 0.1–0.9)
Monocytes: 8 %
Neutrophils Absolute: 3 10*3/uL (ref 1.4–7.0)
Neutrophils: 52 %
Platelets: 286 10*3/uL (ref 150–450)
RBC: 4.42 x10E6/uL (ref 3.77–5.28)
RDW: 13.1 % (ref 11.7–15.4)
WBC: 5.8 10*3/uL (ref 3.4–10.8)

## 2022-04-15 LAB — HEPATITIS C ANTIBODY: Hep C Virus Ab: NONREACTIVE

## 2022-04-16 ENCOUNTER — Ambulatory Visit
Admission: RE | Admit: 2022-04-16 | Discharge: 2022-04-16 | Disposition: A | Discharge status: Home / Self Care | Payer: Medicare Other | Source: Ambulatory Visit | Attending: Orthopaedic Surgery | Admitting: Orthopaedic Surgery

## 2022-04-16 DIAGNOSIS — M4302 Spondylolysis, cervical region: Secondary | ICD-10-CM

## 2022-04-16 MED ORDER — TRIAMCINOLONE ACETONIDE 40 MG/ML IJ SUSP (RADIOLOGY)
60.0000 mg | Freq: Once | INTRAMUSCULAR | Status: AC
  Administered 2022-04-16: 60 mg via EPIDURAL

## 2022-04-16 MED ORDER — IOPAMIDOL (ISOVUE-M 300) INJECTION 61%
1.0000 mL | Freq: Once | INTRAMUSCULAR | Status: AC
  Administered 2022-04-16: 1 mL via EPIDURAL

## 2022-04-16 NOTE — Discharge Instructions (Signed)

## 2022-04-17 ENCOUNTER — Encounter: Payer: Self-pay | Admitting: Internal Medicine

## 2022-04-17 NOTE — Progress Notes (Signed)
[facility] History and Physical   Primary Care Physician:  Lindell Spar, MD   Reason for Procedure:   dysphagia  Plan:    EGD + dilation     HPI: [name] is a 66 y.o. female recently here for colonoscopy and described recurrent dysphagia. Had GE junction stricture dilation to 18 mm 2016  Past Medical History:  Diagnosis Date   Anxiety    Arthritis    BCC (basal cell carcinoma of skin) 11/22/1993   bcc left nasal bridge tx: curet, exc.   BCC (basal cell carcinoma of skin) 12/20/1993   bcc + margin left nasal bridge tx:exc.   Common migraine 08/01/2014   Depression    Diverticulosis    GERD (gastroesophageal reflux disease)    on meds   History of hepatitis A    Osteoporosis    SCC (squamous cell carcinoma) 05/11/2002   scc in situ bowens left cheek   Seasonal allergies    Thyroid nodule    per recent US, has enlarged, but being monitored now    Past Surgical History:  Procedure Laterality Date   ABDOMINAL HYSTERECTOMY N/A    [employer] 08/27/2020   BACK SURGERY  12/24/2006   Lumbosacral spine   COLONOSCOPY     NASAL SINUS SURGERY     neck injection N/A    rotator cuff surgery Right 11/25/2021   SPINE SURGERY N/A    [employer] 08/27/2020   thyroid lumpectomy  08/26/1979   TONSILLECTOMY     UPPER GASTROINTESTINAL ENDOSCOPY     VAGINAL HYSTERECTOMY      Prior to Admission medications   Medication Sig Start Date End Date Taking? Authorizing Provider  Fiber POWD Take 1 Dose by mouth daily at 2 am. Prebiotic fiber supplement    [provider]  loratadine (CLARITIN) 10 MG tablet Take 10 mg by mouth daily.    [provider]  Multiple Vitamins-Minerals (CENTRUM SILVER 50+WOMEN) TABS Take 1 tablet by mouth daily at 6 (six) AM.    [provider]  pantoprazole (PROTONIX) 40 MG tablet Take 1 tablet (40 mg total) by mouth daily. 10/29/21   Lindell Spar, MD  Propylene Glycol-Glycerin ([employer] ARTIFICIAL TEARS) 1-0.3 % SOLN  Apply 1 drop to eye daily. Patient taking differently: Apply 1 drop to eye daily as needed. 08/28/20   Lindell Spar, MD  rizatriptan (MAXALT) 10 MG tablet Take 10 mg by mouth as needed for migraine. May repeat in 2 hours if needed    [provider]  rosuvastatin (CRESTOR) 10 MG tablet Take 1 tablet (10 mg total) by mouth daily. 10/29/21   Lindell Spar, MD  zolpidem (AMBIEN) 10 MG tablet Take 10 mg by mouth at bedtime as needed. 02/21/20   [provider]    Current Outpatient Medications  Medication Sig Dispense Refill   Fiber POWD Take 1 Dose by mouth daily at 2 am. Prebiotic fiber supplement     loratadine (CLARITIN) 10 MG tablet Take 10 mg by mouth daily.     Multiple Vitamins-Minerals (CENTRUM SILVER 50+WOMEN) TABS Take 1 tablet by mouth daily at 6 (six) AM.     pantoprazole (PROTONIX) 40 MG tablet Take 1 tablet (40 mg total) by mouth daily. 90 tablet 3   Propylene Glycol-Glycerin ([employer] ARTIFICIAL TEARS) 1-0.3 % SOLN Apply 1 drop to eye daily. (Patient taking differently: Apply 1 drop to eye daily as needed.) 15 mL 0   rosuvastatin (CRESTOR) 10 MG tablet Take  1 tablet (10 mg total) by mouth daily. 90 tablet 3   zolpidem (AMBIEN) 10 MG tablet Take 10 mg by mouth at bedtime as needed.     rizatriptan (MAXALT) 10 MG tablet Take 10 mg by mouth as needed for migraine. May repeat in 2 hours if needed     Current Facility-Administered Medications  Medication Dose Route Frequency Provider Last Rate Last Admin   0.9 %  sodium chloride infusion  500 mL Intravenous Once Gatha Mayer, MD        Allergies as of 04/18/2022   (Not on File)    Family History  Problem Relation Age of Onset   Diabetes Mother    COPD Mother    Hypertension Mother    Heart attack Father    Migraines Father    Migraines Sister    Stomach cancer Maternal Grandmother    Other Other        brain tumor   Colon cancer Neg Hx    Esophageal cancer Neg Hx    Rectal cancer Neg Hx    Colon polyps  Neg Hx     Social History   Socioeconomic History   Marital status: Married    Spouse name: Not on file   Number of children: 1   Years of education: Not on file   Highest education level: Not on file  Occupational History   Occupation: retired  Tobacco Use   Smoking status: Former    Types: Cigarettes    Quit date: 08/25/1978    Years since quitting: 43.6   Smokeless tobacco: Never  Vaping Use   Vaping Use: Never used  Substance and Sexual Activity   Alcohol use: Not Currently    Alcohol/week: 0.0 - 2.0 standard drinks of alcohol   Drug use: No   Sexual activity: Yes    Birth control/protection: Surgical  Other Topics Concern   Not on file  Social History Narrative   Not on file   Social Determinants of Health   Financial Resource Strain: Low Risk  (05/01/2021)   Overall Financial Resource Strain (CARDIA)    Difficulty of Paying Living Expenses: Not hard at all  Food Insecurity: No Food Insecurity (05/01/2021)   Hunger Vital Sign    Worried About Running Out of Food in the Last Year: Never true    [place] in the Last Year: Never true  Transportation Needs: No Transportation Needs (05/01/2021)   PRAPARE - Hydrologist (Medical): No    Lack of Transportation (Non-Medical): No  Physical Activity: Inactive (05/01/2021)   Exercise Vital Sign    Days of Exercise per Week: 0 days    Minutes of Exercise per Session: 0 min  Stress: No Stress Concern Present (05/01/2021)   [place]    Feeling of Stress : Not at all  Social Connections: Moderately Integrated (05/01/2021)   Social Connection and Isolation Panel [NHANES]    Frequency of Communication with Friends and Family: More than three times a week    Frequency of Social Gatherings with Friends and Family: More than three times a week    Attends Religious Services: More than 4 times per year    Active Member of [employer] or  Organizations: No    Attends Archivist Meetings: Never    Marital Status: Married  Human resources officer Violence: Not At Risk (05/01/2021)   Humiliation, Afraid, Rape, and Kick  questionnaire    Fear of Current or Ex-Partner: No    Emotionally Abused: No    Physically Abused: No    Sexually Abused: No    Review of Systems:  All other review of systems negative except as mentioned in the HPI.  Physical Exam: Vital signs BP (!) 138/92 (BP Location: Right Arm, Patient Position: Sitting, Cuff Size: Normal)   Pulse 60   Temp (!) 97.1 F (36.2 C) (Temporal)   Ht 5' 7.5" (1.715 m)   Wt 168 lb (76.2 kg)   SpO2 100%   BMI 25.92 kg/m   General:   Alert,  Well-developed, well-nourished, pleasant and cooperative in NAD Lungs:  Clear throughout to auscultation.   Heart:  Regular rate and rhythm; no murmurs, clicks, rubs,  or gallops. Abdomen:  Soft, nontender and nondistended. Normal bowel sounds.   Neuro/Psych:  Alert and cooperative. Normal mood and affect. A and O x 3   Gatha Mayer, MD, [facility]

## 2022-04-18 ENCOUNTER — Ambulatory Visit (AMBULATORY_SURGERY_CENTER): Payer: Medicare Other | Admitting: Internal Medicine

## 2022-04-18 ENCOUNTER — Encounter: Payer: Self-pay | Admitting: Internal Medicine

## 2022-04-18 VITALS — BP 107/77 | HR 57 | Temp 97.1°F | Resp 13 | Ht 67.5 in | Wt 168.0 lb

## 2022-04-18 DIAGNOSIS — R1310 Dysphagia, unspecified: Secondary | ICD-10-CM

## 2022-04-18 DIAGNOSIS — K222 Esophageal obstruction: Secondary | ICD-10-CM | POA: Diagnosis not present

## 2022-04-18 DIAGNOSIS — K449 Diaphragmatic hernia without obstruction or gangrene: Secondary | ICD-10-CM

## 2022-04-18 DIAGNOSIS — K317 Polyp of stomach and duodenum: Secondary | ICD-10-CM | POA: Diagnosis not present

## 2022-04-18 DIAGNOSIS — K2970 Gastritis, unspecified, without bleeding: Secondary | ICD-10-CM

## 2022-04-18 MED ORDER — SODIUM CHLORIDE 0.9 % IV SOLN: 500.0000 mL | Freq: Once | INTRAVENOUS | Status: DC

## 2022-04-18 NOTE — Patient Instructions (Addendum)
A  stricture was seen and dilated again - like in 2016. You should swallow better - if not, let me know.  I think staying on pantoprazole (Protonix) makes sense.  I also took stomach biopsies - suspected inflammation and also some tiny benign-appearing polyps.  I appreciate the opportunity to care for you. Gatha Mayer, MD, [facility]  Clear liquids until 11:45, soft foods for the rest of today. Tomorrow you may resume normal diet.  YOU HAD AN ENDOSCOPIC PROCEDURE TODAY AT [facility]:   Refer to the procedure report that was given to you for any specific questions about what was found during the examination.  If the procedure report does not answer your questions, please call your gastroenterologist to clarify.  If you requested that your care partner not be given the details of your procedure findings, then the procedure report has been included in a sealed envelope for you to review at your convenience later.  YOU SHOULD EXPECT: Some feelings of bloating in the abdomen. Passage of more gas than usual.  Walking can help get rid of the air that was put into your GI tract during the procedure and reduce the bloating. If you had a lower endoscopy (such as a colonoscopy or flexible sigmoidoscopy) you may notice spotting of blood in your stool or on the toilet paper. If you underwent a bowel prep for your procedure, you may not have a normal bowel movement for a few days.  Please Note:  You might notice some irritation and congestion in your nose or some drainage.  This is from the oxygen used during your procedure.  There is no need for concern and it should clear up in a day or so.  SYMPTOMS TO REPORT IMMEDIATELY:   Following upper endoscopy (EGD)  Vomiting of blood or coffee ground material  New chest pain or pain under the shoulder blades  Painful or persistently difficult swallowing  New shortness of breath  Fever of 100F or higher  Black, tarry-looking stools  For urgent  or emergent issues, a gastroenterologist can be reached at any hour by calling [phone]. Do not use MyChart messaging for urgent concerns.    DIET:  Clear liquids until 11:45, soft foods for the rest of today.  Tomorrow you may proceed to your regular diet.  Drink plenty of fluids but you should avoid alcoholic beverages for 24 hours.  ACTIVITY:  You should plan to take it easy for the rest of today and you should NOT DRIVE or use heavy machinery until tomorrow (because of the sedation medicines used during the test).    FOLLOW UP: Our staff will call the number listed on your records the next business day following your procedure.  We will call around 7:15- 8:00 am to check on you and address any questions or concerns that you may have regarding the information given to you following your procedure. If we do not reach you, we will leave a message.  If you develop any symptoms (ie: fever, flu-like symptoms, shortness of breath, cough etc.) before then, please call [phone].  If you test positive for Covid 19 in the 2 weeks post procedure, please call and report this information to [place].    If any biopsies were taken you will be contacted by phone or by letter within the next 1-3 weeks.  Please call us at [phone] if you have not heard about the biopsies in 3 weeks.    SIGNATURES/CONFIDENTIALITY: You and/or  your care partner have signed paperwork which will be entered into your electronic medical record.  These signatures attest to the fact that that the information above on your After Visit Summary has been reviewed and is understood.  Full responsibility of the confidentiality of this discharge information lies with you and/or your care-partner.

## 2022-04-18 NOTE — Progress Notes (Signed)
Called to room to assist during endoscopic procedure.  Patient ID and intended procedure confirmed with present staff. Received instructions for my participation in the procedure from the performing physician.  

## 2022-04-18 NOTE — Op Note (Signed)
[place] Patient Name: [name] Procedure Date: 04/18/2022 10:22 AM MRN: [id number] Endoscopist: Gatha Mayer , MD Age: 66 Referring MD:  Date of Birth: [birth year] Gender: Female Account #: [id number] Procedure:                Upper GI endoscopy Indications:              Dysphagia Medicines:                Monitored Anesthesia Care Procedure:                Pre-Anesthesia Assessment:                           - Prior to the procedure, a History and Physical                            was performed, and patient medications and                            allergies were reviewed. The patient's tolerance of                            previous anesthesia was also reviewed. The risks                            and benefits of the procedure and the sedation                            options and risks were discussed with the patient.                            All questions were answered, and informed consent                            was obtained. Prior Anticoagulants: The patient has                            taken no previous anticoagulant or antiplatelet                            agents. ASA Grade Assessment: II - A patient with                            mild systemic disease. After reviewing the risks                            and benefits, the patient was deemed in                            satisfactory condition to undergo the procedure.                           After obtaining informed consent, the endoscope was  passed under direct vision. Throughout the                            procedure, the patient's blood pressure, pulse, and                            oxygen saturations were monitored continuously. The                            Endoscope was introduced through the mouth, and                            advanced to the second part of duodenum. The upper                            GI endoscopy was accomplished without  difficulty.                            The patient tolerated the procedure well. Scope In: Scope Out: Findings:                 One benign-appearing, intrinsic moderate                            (circumferential scarring or stenosis; an endoscope                            may pass) stenosis was found at the                            gastroesophageal junction. This stenosis measured                            less than one cm (in length). The stenosis was                            traversed. A TTS dilator was passed through the                            scope. Dilation with an 18-19-20 mm balloon dilator                            was performed to 20 mm. The dilation site was                            examined and showed mild mucosal disruption.                            Estimated blood loss was minimal.                           The gastroesophageal flap valve was visualized  endoscopically and classified as Hill Grade IV (no                            fold, wide open lumen, hiatal hernia present).                           A 2 cm hiatal hernia was present.                           Multiple diminutive sessile polyps were found in                            the gastric body. Biopsies were taken with a cold                            forceps for histology. Verification of patient                            identification for the specimen was done. Estimated                            blood loss was minimal.                           Diffuse moderate inflammation characterized by                            erythema and pale mucosa was found in the gastric                            antrum. Biopsies were taken with a cold forceps for                            histology. Verification of patient identification                            for the specimen was done. Estimated blood loss was                            minimal.                           The  examined duodenum was normal.                           The cardia and gastric fundus were normal on                            retroflexion. Complications:            No immediate complications. Estimated Blood Loss:     Estimated blood loss was minimal. Impression:               - Benign-appearing esophageal stenosis. Dilated.                           -  Gastroesophageal flap valve classified as Hill                            Grade IV (no fold, wide open lumen, hiatal hernia                            present).                           - 2 cm hiatal hernia.                           - Multiple gastric polyps. Biopsied.                           - Gastritis. Biopsied.                           - Normal examined duodenum. Recommendation:           - Patient has a contact number available for                            emergencies. The signs and symptoms of potential                            delayed complications were discussed with the                            patient. Return to normal activities tomorrow.                            Written discharge instructions were provided to the                            patient.                           - Clear liquids x 1 hour then soft foods rest of                            day. Start prior diet tomorrow.                           - Continue present medications.                           - Await pathology results. Gatha Mayer, MD 04/18/2022 10:46:14 AM This report has been signed electronically.

## 2022-04-18 NOTE — Progress Notes (Signed)
To pacu, VSS. Report to [email or web address] 

## 2022-04-21 ENCOUNTER — Telehealth: Payer: Self-pay

## 2022-04-21 NOTE — Telephone Encounter (Signed)
Attempted to reach patient for post-procedure f/u call. No answer. Left message for her to please not hesitate to call us if she has any questions/concerns regarding her care. 

## 2022-04-25 ENCOUNTER — Encounter: Payer: Self-pay | Admitting: Internal Medicine

## 2022-04-29 IMAGING — MG MM DIGITAL DIAGNOSTIC UNILAT*R* W/ TOMO W/ CAD
4 series · 4 of 12 positions shown · non-contrast
Comparison: Previous exam(s).

CLINICAL DATA: Patient was called back for a possible right breast
mass.

EXAM:
DIGITAL DIAGNOSTIC UNILATERAL RIGHT MAMMOGRAM WITH TOMOSYNTHESIS AND
CAD
TECHNIQUE: Right digital diagnostic mammography and breast tomosynthesis was
performed. The images were evaluated with computer-aided detection.

[R ML synth-2D]
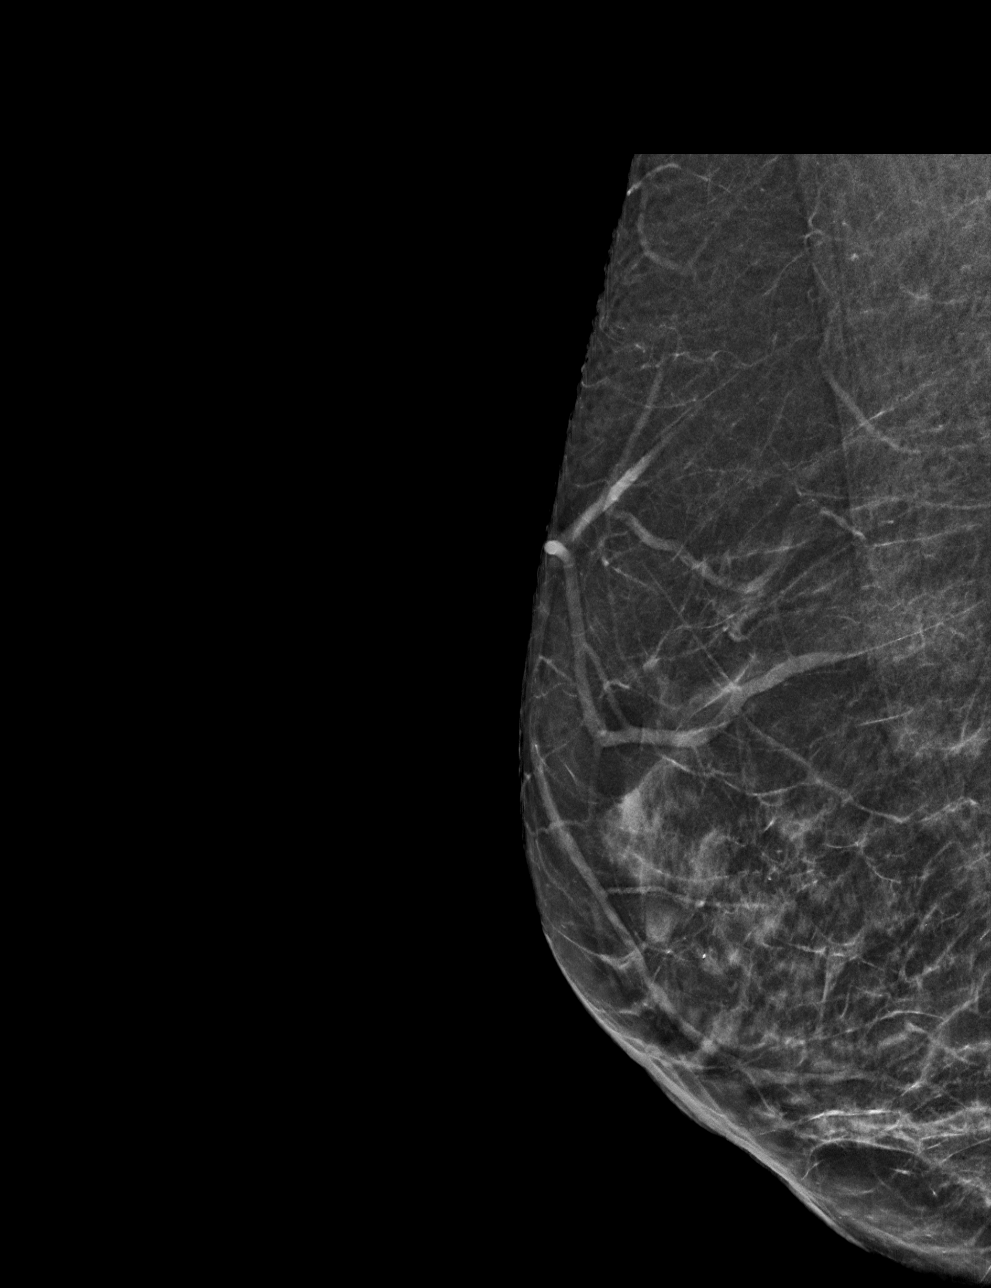

[R CC synth-2D]
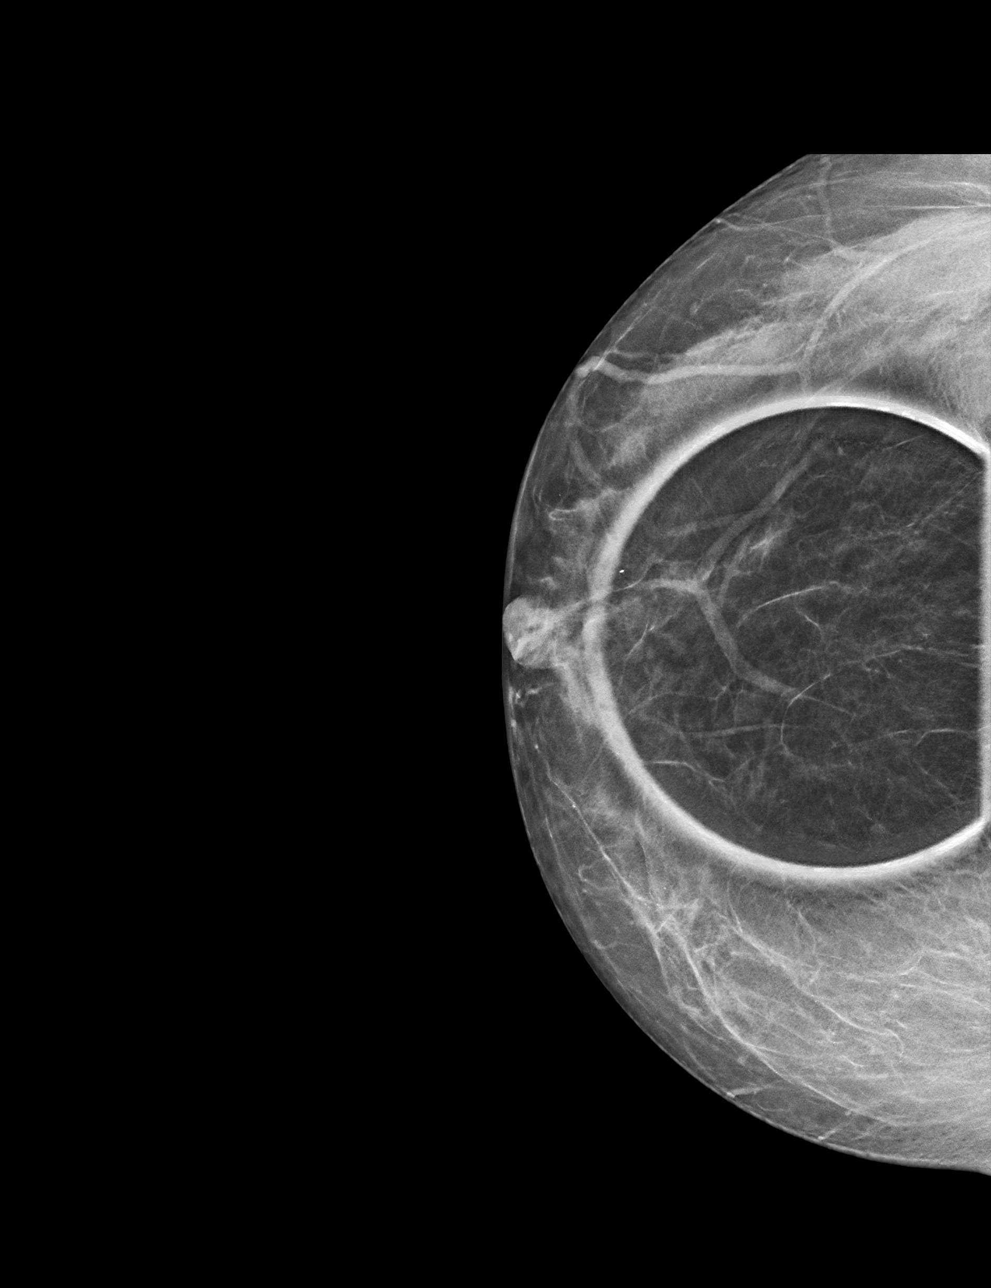

[R ML tomo · tomo slice 25/48.0]
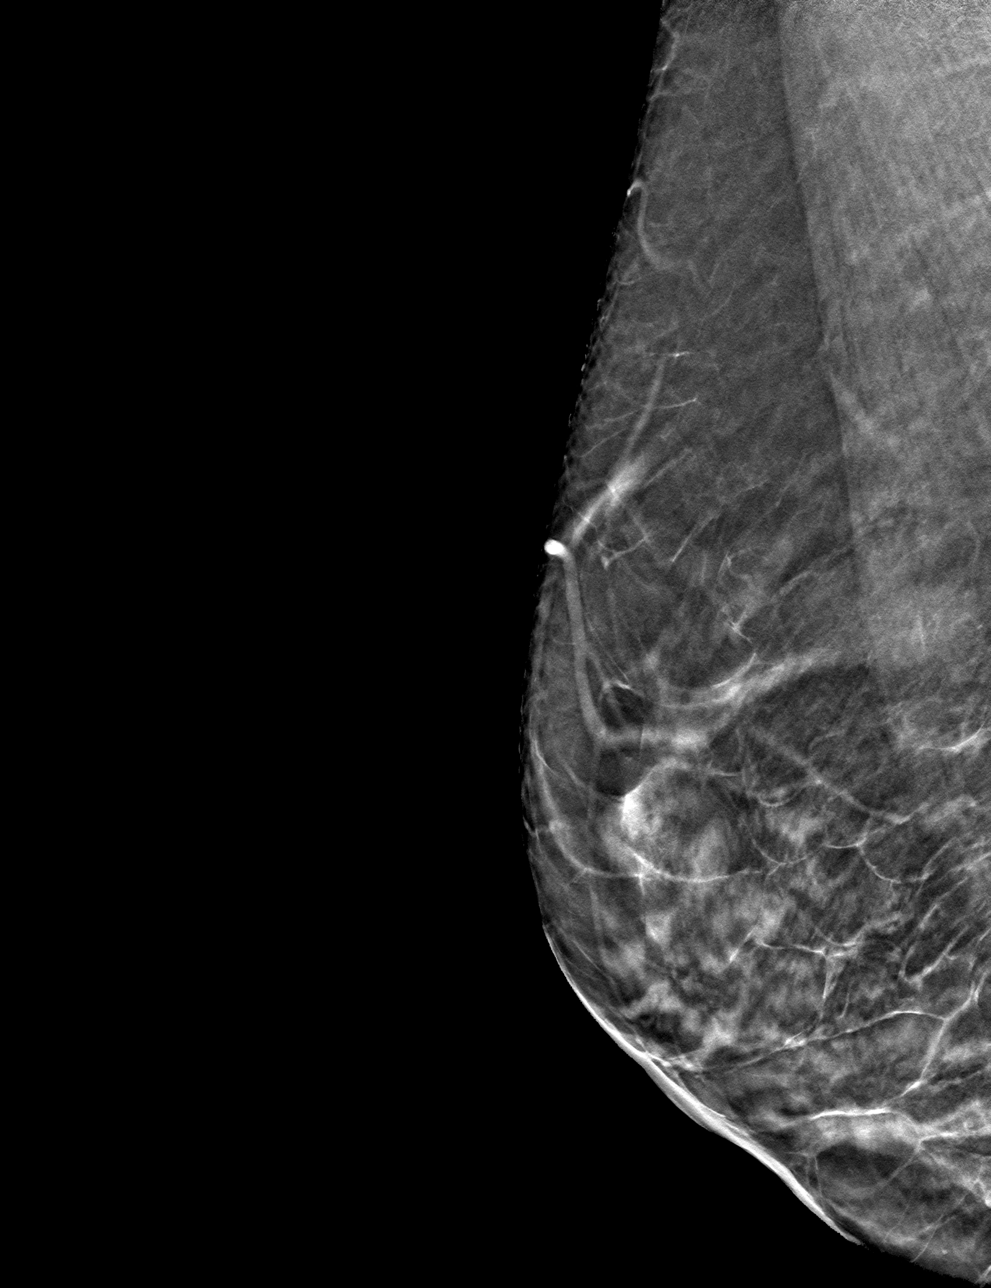

[R CC tomo · tomo slice 21/40.0]
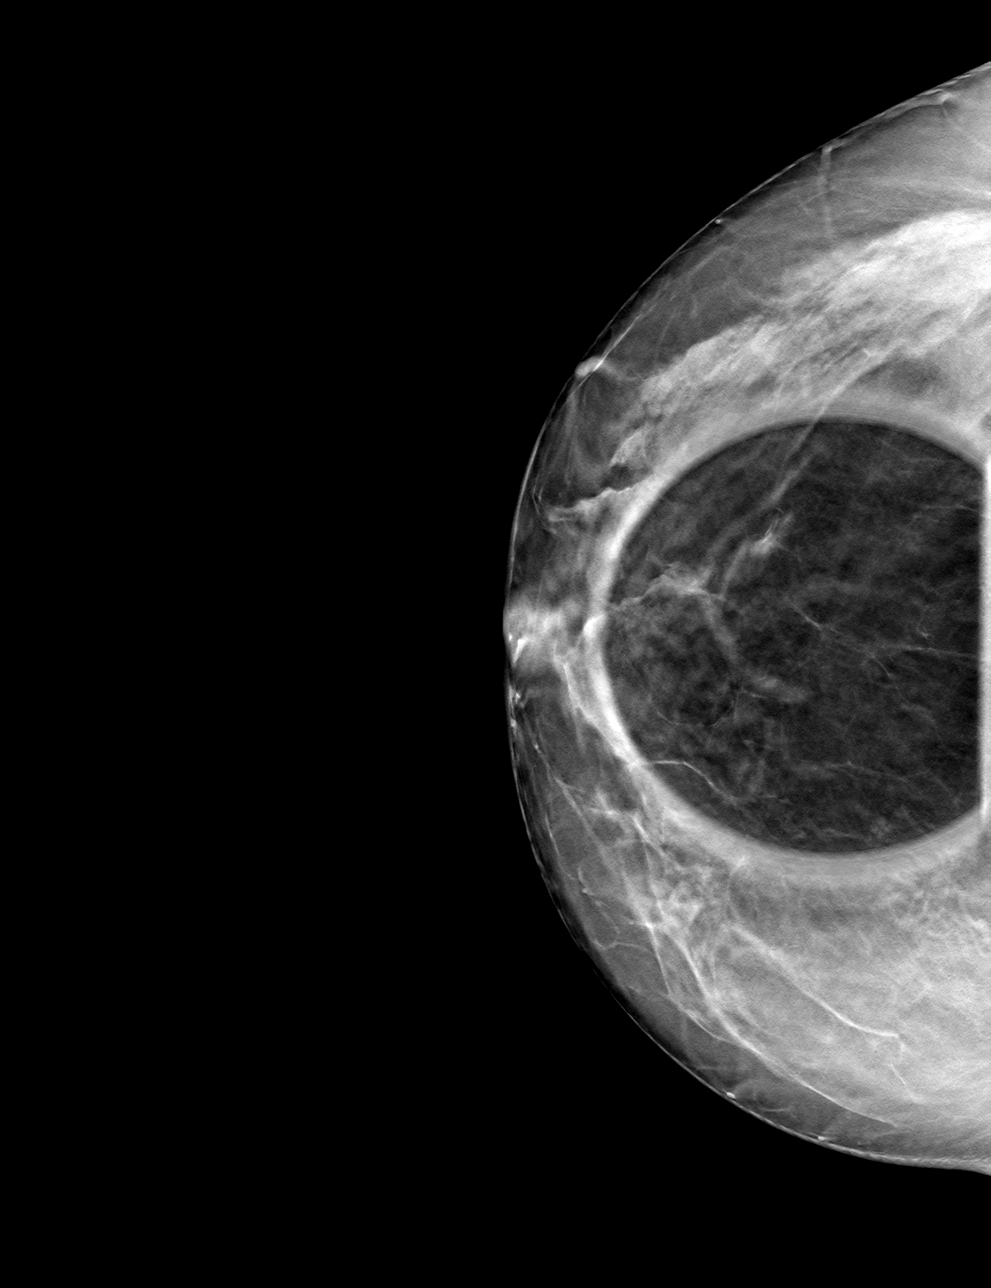

[4 of 12 positions shown; findings below may reference images not displayed]

ACR Breast Density Category b: There are scattered areas of
fibroglandular density.
FINDINGS: Possible right breast mass resolves on today's imaging.

The patient has retropectoral implants.
IMPRESSION: No mammographic evidence of malignancy. No suspicious masses
identified.

RECOMMENDATION:
Annual screening mammography.

I have discussed the findings and recommendations with the patient.
If applicable, a reminder letter will be sent to the patient
regarding the next appointment.

BI-RADS CATEGORY  2: Benign.

## 2022-05-07 ENCOUNTER — Ambulatory Visit (INDEPENDENT_AMBULATORY_CARE_PROVIDER_SITE_OTHER): Payer: Medicare Other

## 2022-05-07 DIAGNOSIS — Z0000 Encounter for general adult medical examination without abnormal findings: Secondary | ICD-10-CM

## 2022-05-07 NOTE — Progress Notes (Signed)
Subjective:   CAMBREA KIRT is a 66 y.o. female who presents for Medicare Annual (Subsequent) preventive examination. I connected with  TYESE FINKEN on 05/07/22 by a audio enabled telemedicine application and verified that I am speaking with the correct person using two identifiers.  Patient Location: Home  Provider Location: Office/Clinic  I discussed the limitations of evaluation and management by telemedicine. The patient expressed understanding and agreed to proceed.   Review of Systems     Ms. Arlis Porta , Thank you for taking time to come for your Medicare Wellness Visit. I appreciate your ongoing commitment to your health goals. Please review the following plan we discussed and let me know if I can assist you in the future.   These are the goals we discussed:  Goals      Patient Stated     Would like to exercise more and drink more water        This is a list of the screening recommended for you and due dates:  Health Maintenance  Topic Date Due   Tetanus Vaccine  Never done   Zoster (Shingles) Vaccine (1 of 2) Never done   COVID-19 Vaccine (7 - Moderna series) 10/19/2021   Flu Shot  03/25/2022   Mammogram  10/08/2023   Colon Cancer Screening  04/11/2029   Pneumonia Vaccine  Completed   DEXA scan (bone density measurement)  Completed   Hepatitis C Screening: USPSTF Recommendation to screen - Ages 50-79 yo.  Completed   HPV Vaccine  Aged Out          Objective:    There were no vitals filed for this visit. There is no height or weight on file to calculate BMI.     05/01/2021    8:17 AM 05/16/2015    7:12 AM 08/01/2014    7:55 AM  Advanced Directives  Does Patient Have a Medical Advance Directive? No No No  Would patient like information on creating a medical advance directive? Yes (MAU/Ambulatory/Procedural Areas - Information given)      Current Medications (verified) Outpatient Encounter Medications as of 05/07/2022  Medication Sig   Fiber POWD Take  1 Dose by mouth daily at 2 am. Prebiotic fiber supplement   loratadine (CLARITIN) 10 MG tablet Take 10 mg by mouth daily.   Multiple Vitamins-Minerals (CENTRUM SILVER 50+WOMEN) TABS Take 1 tablet by mouth daily at 6 (six) AM.   pantoprazole (PROTONIX) 40 MG tablet Take 1 tablet (40 mg total) by mouth daily.   Propylene Glycol-Glycerin ([employer] ARTIFICIAL TEARS) 1-0.3 % SOLN Apply 1 drop to eye daily. (Patient taking differently: Apply 1 drop to eye daily as needed.)   rizatriptan (MAXALT) 10 MG tablet Take 10 mg by mouth as needed for migraine. May repeat in 2 hours if needed   rosuvastatin (CRESTOR) 10 MG tablet Take 1 tablet (10 mg total) by mouth daily.   zolpidem (AMBIEN) 10 MG tablet Take 10 mg by mouth at bedtime as needed.   No facility-administered encounter medications on file as of 05/07/2022.    Allergies (verified) Patient has no allergy information on record.   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   BCC (basal cell carcinoma of skin) 12/20/1993   bcc + margin left nasal bridge tx:exc.   Common migraine 08/01/2014   Depression  Diverticulosis    GERD (gastroesophageal reflux disease)    on meds   History of hepatitis A    Osteoporosis    SCC (squamous cell carcinoma) 05/11/2002   scc in situ bowens left cheek   Seasonal allergies    Thyroid nodule    per recent US, has enlarged, but being monitored now   Past Surgical History:  Procedure Laterality Date   ABDOMINAL HYSTERECTOMY N/A    [employer] 08/27/2020   BACK SURGERY  12/24/2006   Lumbosacral spine   COLONOSCOPY     NASAL SINUS SURGERY     neck injection N/A    rotator cuff surgery Right 11/25/2021   SPINE SURGERY N/A    [employer] 08/27/2020   thyroid lumpectomy  08/26/1979   TONSILLECTOMY     UPPER GASTROINTESTINAL ENDOSCOPY     VAGINAL HYSTERECTOMY     Family History  Problem  Relation Age of Onset   Diabetes Mother    COPD Mother    Hypertension Mother    Heart attack Father    Migraines Father    Migraines Sister    Stomach cancer Maternal Grandmother    Other Other        brain tumor   Colon cancer Neg Hx    Esophageal cancer Neg Hx    Rectal cancer Neg Hx    Colon polyps Neg Hx    Social History   Socioeconomic History   Marital status: Married    Spouse name: Not on file   Number of children: 1   Years of education: Not on file   Highest education level: Not on file  Occupational History   Occupation: retired  Tobacco Use   Smoking status: Former    Types: Cigarettes    Quit date: 08/25/1978    Years since quitting: 43.7   Smokeless tobacco: Never  Vaping Use   Vaping Use: Never used  Substance and Sexual Activity   Alcohol use: Not Currently    Alcohol/week: 0.0 - 2.0 standard drinks of alcohol   Drug use: No   Sexual activity: Yes    Birth control/protection: Surgical  Other Topics Concern   Not on file  Social History Narrative   Not on file   Social Determinants of Health   Financial Resource Strain: Low Risk  (05/01/2021)   Overall Financial Resource Strain (CARDIA)    Difficulty of Paying Living Expenses: Not hard at all  Food Insecurity: No Food Insecurity (05/01/2021)   Hunger Vital Sign    Worried About Running Out of Food in the Last Year: Never true    Ran Out of Food in the Last Year: Never true  Transportation Needs: No Transportation Needs (05/01/2021)   PRAPARE - Hydrologist (Medical): No    Lack of Transportation (Non-Medical): No  Physical Activity: Inactive (05/01/2021)   Exercise Vital Sign    Days of Exercise per Week: 0 days    Minutes of Exercise per Session: 0 min  Stress: No Stress Concern Present (05/01/2021)   [place]    Feeling of Stress : Not at all  Social Connections: Moderately Integrated (05/01/2021)    Social Connection and Isolation Panel [NHANES]    Frequency of Communication with Friends and Family: More than three times a week    Frequency of Social Gatherings with Friends and Family: More than three times a week    Attends Religious Services: More than  4 times per year    Active Member of Clubs or Organizations: No    Attends Archivist Meetings: Never    Marital Status: Married    Tobacco Counseling Counseling given: Not Answered   Clinical Intake:                 Diabetic? No         Activities of Daily Living     No data to display           Patient Care Team: Lindell Spar, MD as PCP - General (Internal Medicine)  Indicate any recent Medical Services you may have received from other than [facility] providers in the past year (date may be approximate).     Assessment:   This is a routine wellness examination for Sherlonda.  Hearing/Vision screen No results found.  Dietary issues and exercise activities discussed:     Goals Addressed   None   Depression Screen    10/29/2021    7:59 AM 05/01/2021    8:14 AM 10/30/2020   10:03 AM 08/28/2020    9:04 AM  PHQ 2/9 Scores  PHQ - 2 Score 0 2 0 0  PHQ- 9 Score  6      Fall Risk    10/29/2021    7:59 AM 05/01/2021    8:18 AM 10/30/2020   10:03 AM 08/28/2020    9:04 AM 03/17/2018   12:32 PM  Fall Risk   Falls in the past year? 0 0 0 0 No  Number falls in past yr: 0 0 0 0   Injury with Fall? 0 0 0 0   Risk for fall due to : No Fall Risks No Fall Risks No Fall Risks No Fall Risks   Follow up Falls evaluation completed Falls evaluation completed Falls evaluation completed Falls evaluation completed     [place]:  Any stairs in or around the home? Yes  If so, are there any without handrails? No  Home free of loose throw rugs in walkways, pet beds, electrical cords, etc? Yes  Adequate lighting in your home to reduce risk of falls? Yes   ASSISTIVE DEVICES  UTILIZED TO PREVENT FALLS:  Life alert? No  Use of a cane, walker or w/c? No  Grab bars in the bathroom? No  Shower chair or bench in shower? No  Elevated toilet seat or a handicapped toilet? No     Cognitive Function:    05/01/2021    8:19 AM  MMSE - Mini Mental State Exam  Not completed: Unable to complete        05/01/2021    8:19 AM  6CIT Screen  What Year? 0 points  What time? 0 points  Count back from 20 0 points  Months in reverse 0 points  Repeat phrase 0 points    Immunizations Immunization History  Administered Date(s) Administered   Influenza, Quadrivalent, Recombinant, Inj, Pf 05/06/2018, 06/18/2021   Influenza,inj,Quad PF,6+ Mos 08/22/2017, 07/07/2019   Influenza-Unspecified 05/07/2016, 07/28/2020, 06/18/2021   Moderna SARS-COV2 Booster Vaccination 06/18/2021   Moderna Sars-Covid-2 Vaccination 09/30/2019, 10/29/2019, 06/28/2020, 01/10/2021   PNEUMOCOCCAL CONJUGATE-20 05/01/2021   Unspecified SARS-COV-2 Vaccination 09/30/2019, 10/29/2019, 06/28/2020, 01/10/2021, 06/18/2021    TDAP status: Due, Education has been provided regarding the importance of this vaccine. Advised may receive this vaccine at local pharmacy or Health Dept. Aware to provide a copy of the vaccination record if obtained from local pharmacy or Health  Dept. Verbalized acceptance and understanding.  Flu Vaccine status: Due, Education has been provided regarding the importance of this vaccine. Advised may receive this vaccine at local pharmacy or Health Dept. Aware to provide a copy of the vaccination record if obtained from local pharmacy or Health Dept. Verbalized acceptance and understanding.  Pneumococcal vaccine status: Up to date  Covid-19 vaccine status: Completed vaccines  Qualifies for Shingles Vaccine? Yes   Zostavax completed No   Shingrix Completed?: No.    Education has been provided regarding the importance of this vaccine. Patient has been advised to call insurance company to  determine out of pocket expense if they have not yet received this vaccine. Advised may also receive vaccine at local pharmacy or Health Dept. Verbalized acceptance and understanding.  Screening Tests Health Maintenance  Topic Date Due   TETANUS/TDAP  Never done   Zoster Vaccines- Shingrix (1 of 2) Never done   COVID-19 Vaccine (7 - Moderna series) 10/19/2021   INFLUENZA VACCINE  03/25/2022   MAMMOGRAM  10/08/2023   COLONOSCOPY (Pts 45-81yr Insurance coverage will need to be confirmed)  04/11/2029   Pneumonia Vaccine 67 Years old  Completed   DEXA SCAN  Completed   Hepatitis C Screening  Completed   HPV VACCINES  Aged Out    Health Maintenance  Health Maintenance Due  Topic Date Due   TETANUS/TDAP  Never done   Zoster Vaccines- Shingrix (1 of 2) Never done   COVID-19 Vaccine (7 - Moderna series) 10/19/2021   INFLUENZA VACCINE  03/25/2022    Colorectal cancer screening: Type of screening: Colonoscopy. Completed 04/11/2022. Repeat every 7 years  Mammogram status: Completed 10/07/2021. Repeat every year  Bone Density status: Completed 10/07/2021. Results reflect: Bone density results: OSTEOPOROSIS. Repeat every 2 years.  Lung Cancer Screening: (Low Dose CT Chest recommended if Age 67-80years, 30 pack-year currently smoking OR have quit w/in 15years.) does not qualify.     Additional Screening:  Hepatitis C Screening: does qualify; Completed 04/14/2022  Vision Screening: Recommended annual ophthalmology exams for early detection of glaucoma and other disorders of the eye. Is the patient up to date with their annual eye exam?  No  Who is the provider or what is the name of the office in which the patient attends annual eye exams? N/A If pt is not established with a provider, would they like to be referred to a provider to establish care? Yes .   Dental Screening: Recommended annual dental exams for proper oral hygiene  Community Resource Referral / Chronic Care  Management: CRR required this visit?  No   CCM required this visit?  No      Plan:     I have personally reviewed and noted the following in the patient's chart:   Medical and social history Use of alcohol, tobacco or illicit drugs  Current medications and supplements including opioid prescriptions. Patient is not currently taking opioid prescriptions. Functional ability and status Nutritional status Physical activity Advanced directives List of other physicians Hospitalizations, surgeries, and ER visits in previous 12 months Vitals Screenings to include cognitive, depression, and falls Referrals and appointments  In addition, I have reviewed and discussed with patient certain preventive protocols, quality metrics, and best practice recommendations. A written personalized care plan for preventive services as well as general preventive health recommendations were provided to patient.     KJohny Drilling C[place]  05/07/2022   Nurse Notes:  Ms. HLabrake, Thank you for taking time to come  for your Medicare Wellness Visit. I appreciate your ongoing commitment to your health goals. Please review the following plan we discussed and let me know if I can assist you in the future.   These are the goals we discussed:  Goals      Patient Stated     Would like to exercise more and drink more water        This is a list of the screening recommended for you and due dates:  Health Maintenance  Topic Date Due   Tetanus Vaccine  Never done   Zoster (Shingles) Vaccine (1 of 2) Never done   COVID-19 Vaccine (7 - Moderna series) 10/19/2021   Flu Shot  03/25/2022   Mammogram  10/08/2023   Colon Cancer Screening  04/11/2029   Pneumonia Vaccine  Completed   DEXA scan (bone density measurement)  Completed   Hepatitis C Screening: USPSTF Recommendation to screen - Ages 52-79 yo.  Completed   HPV Vaccine  Aged Out

## 2022-05-07 NOTE — Patient Instructions (Signed)
[name] , Thank you for taking time to come for your Medicare Wellness Visit. I appreciate your ongoing commitment to your health goals. Please review the following plan we discussed and let me know if I can assist you in the future.   Screening recommendations/referrals: Colonoscopy: Completed Mammogram: Completed Bone Density: Completed Recommended yearly ophthalmology/optometry visit for glaucoma screening and checkup Recommended yearly dental visit for hygiene and checkup  Vaccinations: Influenza vaccine: Due Pneumococcal vaccine: Up to date Tdap vaccine: Due Shingles vaccine: Due    Advanced directives: patient declined  Conditions/risks identified: Falls  Next appointment: 1 year   Preventive Care 67 Years and Older, Female Preventive care refers to lifestyle choices and visits with your health care provider that can promote health and wellness. What does preventive care include? A yearly physical exam. This is also called an annual well check. Dental exams once or twice a year. Routine eye exams. Ask your health care provider how often you should have your eyes checked. Personal lifestyle choices, including: Daily care of your teeth and gums. Regular physical activity. Eating a healthy diet. Avoiding tobacco and drug use. Limiting alcohol use. Practicing safe sex. Taking low-dose aspirin every day. Taking vitamin and mineral supplements as recommended by your health care provider. What happens during an annual well check? The services and screenings done by your health care provider during your annual well check will depend on your age, overall health, lifestyle risk factors, and family history of disease. Counseling  Your health care provider may ask you questions about your: Alcohol use. Tobacco use. Drug use. Emotional well-being. Home and relationship well-being. Sexual activity. Eating habits. History of falls. Memory and ability to understand  (cognition). Work and work Statistician. Reproductive health. Screening  You may have the following tests or measurements: Height, weight, and BMI. Blood pressure. Lipid and cholesterol levels. These may be checked every 5 years, or more frequently if you are over 4 years old. Skin check. Lung cancer screening. You may have this screening every year starting at age 67 if you have a 30-pack-year history of smoking and currently smoke or have quit within the past 15 years. Fecal occult blood test (FOBT) of the stool. You may have this test every year starting at age 67. Flexible sigmoidoscopy or colonoscopy. You may have a sigmoidoscopy every 5 years or a colonoscopy every 10 years starting at age 67. Hepatitis C blood test. Hepatitis B blood test. Sexually transmitted disease (STD) testing. Diabetes screening. This is done by checking your blood sugar (glucose) after you have not eaten for a while (fasting). You may have this done every 1-3 years. Bone density scan. This is done to screen for osteoporosis. You may have this done starting at age 67. Mammogram. This may be done every 1-2 years. Talk to your health care provider about how often you should have regular mammograms. Talk with your health care provider about your test results, treatment options, and if necessary, the need for more tests. Vaccines  Your health care provider may recommend certain vaccines, such as: Influenza vaccine. This is recommended every year. Tetanus, diphtheria, and acellular pertussis (Tdap, Td) vaccine. You may need a Td booster every 10 years. Zoster vaccine. You may need this after age 67. Pneumococcal 13-valent conjugate (PCV13) vaccine. One dose is recommended after age 67. Pneumococcal polysaccharide (PPSV23) vaccine. One dose is recommended after age 67. Talk to your health care provider about which screenings and vaccines you need and how often you need them.  This information is not intended to  replace advice given to you by your health care provider. Make sure you discuss any questions you have with your health care provider. Document Released: 09/07/2015 Document Revised: 04/30/2016 Document Reviewed: 06/12/2015 Elsevier Interactive Patient Education  2017 [place] Prevention in the Home Falls can cause injuries. They can happen to people of all ages. There are many things you can do to make your home safe and to help prevent falls. What can I do on the outside of my home? Regularly fix the edges of walkways and driveways and fix any cracks. Remove anything that might make you trip as you walk through a door, such as a raised step or threshold. Trim any bushes or trees on the path to your home. Use bright outdoor lighting. Clear any walking paths of anything that might make someone trip, such as rocks or tools. Regularly check to see if handrails are loose or broken. Make sure that both sides of any steps have handrails. Any raised decks and porches should have guardrails on the edges. Have any leaves, snow, or ice cleared regularly. Use sand or salt on walking paths during winter. Clean up any spills in your garage right away. This includes oil or grease spills. What can I do in the bathroom? Use night lights. Install grab bars by the toilet and in the tub and shower. Do not use towel bars as grab bars. Use non-skid mats or decals in the tub or shower. If you need to sit down in the shower, use a plastic, non-slip stool. Keep the floor dry. Clean up any water that spills on the floor as soon as it happens. Remove soap buildup in the tub or shower regularly. Attach bath mats securely with double-sided non-slip rug tape. Do not have throw rugs and other things on the floor that can make you trip. What can I do in the bedroom? Use night lights. Make sure that you have a light by your bed that is easy to reach. Do not use any sheets or blankets that are too big for  your bed. They should not hang down onto the floor. Have a firm chair that has side arms. You can use this for support while you get dressed. Do not have throw rugs and other things on the floor that can make you trip. What can I do in the kitchen? Clean up any spills right away. Avoid walking on wet floors. Keep items that you use a lot in easy-to-reach places. If you need to reach something above you, use a strong step stool that has a grab bar. Keep electrical cords out of the way. Do not use floor polish or wax that makes floors slippery. If you must use wax, use non-skid floor wax. Do not have throw rugs and other things on the floor that can make you trip. What can I do with my stairs? Do not leave any items on the stairs. Make sure that there are handrails on both sides of the stairs and use them. Fix handrails that are broken or loose. Make sure that handrails are as long as the stairways. Check any carpeting to make sure that it is firmly attached to the stairs. Fix any carpet that is loose or worn. Avoid having throw rugs at the top or bottom of the stairs. If you do have throw rugs, attach them to the floor with carpet tape. Make sure that you have a light switch at the  top of the stairs and the bottom of the stairs. If you do not have them, ask someone to add them for you. What else can I do to help prevent falls? Wear shoes that: Do not have high heels. Have rubber bottoms. Are comfortable and fit you well. Are closed at the toe. Do not wear sandals. If you use a stepladder: Make sure that it is fully opened. Do not climb a closed stepladder. Make sure that both sides of the stepladder are locked into place. Ask someone to hold it for you, if possible. Clearly mark and make sure that you can see: Any grab bars or handrails. First and last steps. Where the edge of each step is. Use tools that help you move around (mobility aids) if they are needed. These  include: Canes. Walkers. Scooters. Crutches. Turn on the lights when you go into a dark area. Replace any light bulbs as soon as they burn out. Set up your furniture so you have a clear path. Avoid moving your furniture around. If any of your floors are uneven, fix them. If there are any pets around you, be aware of where they are. Review your medicines with your doctor. Some medicines can make you feel dizzy. This can increase your chance of falling. Ask your doctor what other things that you can do to help prevent falls. This information is not intended to replace advice given to you by your health care provider. Make sure you discuss any questions you have with your health care provider. Document Released: 06/07/2009 Document Revised: 01/17/2016 Document Reviewed: 09/15/2014 Elsevier Interactive Patient Education  2017 [employer].

## 2022-05-09 ENCOUNTER — Telehealth: Payer: Self-pay | Admitting: Internal Medicine

## 2022-05-09 NOTE — Telephone Encounter (Signed)
Inbound call from patient stating that yesterday afternoon she started experiencing sharp abd pain, bloating and it is very hard for her to sit down. Patient is requesting a call back to discuss. Please advise.

## 2022-05-13 ENCOUNTER — Other Ambulatory Visit: Payer: Self-pay | Admitting: Endocrinology

## 2022-05-13 DIAGNOSIS — E041 Nontoxic single thyroid nodule: Secondary | ICD-10-CM

## 2022-05-26 NOTE — Telephone Encounter (Signed)
Patients spouse called states the patient is not doing any better since the last message from 05/09/2022.

## 2022-05-26 NOTE — Telephone Encounter (Signed)
Recent colonoscopy and upper endoscopy reviewed. There are number of possibilities for her symptom complex.  This seems to be something new, based on chart review.  I do not feel comfortable advising over the phone (particularly with fever and abdominal pain).  She should either present herself to her PCP, or one of the urgent care centers.  She could see one of our advanced practitioners, as well, if any available appointments. Thanks, Dr. Henrene Pastor

## 2022-05-26 NOTE — Telephone Encounter (Signed)
Dr. Carlean Purl Pt Pt stated that three days ago she started having lower abdominal pain, abdominal cramping, chills, fever, abdominal swelling, Bloating. Last BM yesterday ( soft /mushy) Slight nausea, Eating soft foods, jello, toast, eggs. Please advise as DOD

## 2022-05-27 NOTE — Telephone Encounter (Signed)
Pt was made aware of Dr. Henrene Pastor recommendations: Pt stated that she if feeling somewhat better today with no fever: Pt verbalized understanding with all questions answered.

## 2022-05-27 NOTE — Telephone Encounter (Signed)
Left message for pt to call back  °

## 2022-08-13 ENCOUNTER — Telehealth: Payer: Self-pay

## 2022-08-13 NOTE — Telephone Encounter (Signed)
Key: [id number] - [place] Case ID: [id number] Status - pending  Sent to Plan today Drug-Nurtec '75MG'$  dispersible tablets Form [employer] Medicare Part D Electronic Prior Authorization Form (2017 [place])

## 2022-09-06 ENCOUNTER — Encounter: Payer: Self-pay | Admitting: Internal Medicine

## 2022-09-11 ENCOUNTER — Telehealth (INDEPENDENT_AMBULATORY_CARE_PROVIDER_SITE_OTHER): Payer: Medicare Other | Admitting: Family Medicine

## 2022-09-11 ENCOUNTER — Encounter: Payer: Self-pay | Admitting: Family Medicine

## 2022-09-11 VITALS — Temp 99.1°F | Ht 67.0 in | Wt 168.0 lb

## 2022-09-11 DIAGNOSIS — U071 COVID-19: Secondary | ICD-10-CM | POA: Diagnosis not present

## 2022-09-11 MED ORDER — LIDOCAINE VISCOUS HCL 2 % MT SOLN: 15.0000 mL | OROMUCOSAL | 0 refills | Status: DC | PRN

## 2022-09-11 MED ORDER — NIRMATRELVIR/RITONAVIR (PAXLOVID)TABLET: 3.0000 | ORAL_TABLET | Freq: Two times a day (BID) | ORAL | 0 refills | Status: AC

## 2022-09-11 MED ORDER — AZELASTINE-FLUTICASONE 137-50 MCG/ACT NA SUSP: 1.0000 | Freq: Two times a day (BID) | NASAL | 0 refills | Status: DC

## 2022-09-11 MED ORDER — LEVOCETIRIZINE DIHYDROCHLORIDE 5 MG PO TABS
5.0000 mg | ORAL_TABLET | Freq: Once | ORAL | 0 refills | Status: DC | PRN
Start: 2022-09-11 — End: 2022-10-30

## 2022-09-11 MED ORDER — GUAIFENESIN 100 MG/5ML PO LIQD: 5.0000 mL | ORAL | 0 refills | Status: DC | PRN

## 2022-09-11 NOTE — Progress Notes (Signed)
Virtual Visit via Video Note  I connected with [name] on 09/14/22 at  3:00 PM EST by a video enabled telemedicine application and verified that I am speaking with the correct person using two identifiers.  Patient Location: Home Provider Location: Office/Clinic  Total time spent with patient via telehealth 10 minutes    I discussed the limitations, risks, security, and privacy concerns of performing an evaluation and management service by video and the availability of in person appointments. I also discussed with the patient that there may be a patient responsible charge related to this service. The patient expressed understanding and agreed to proceed.  Subjective: PCP: Lindell Spar, MD  Chief Complaint  Patient presents with   Sore Throat    Patient complains of headache, sore throat, fever starting 4 days ago.    Ms.[name] 67 year old female, with past medical history reviewed. Patient presents via telehealth with positive at home Covid test done today, her symptoms started on Monday. Her symptoms include nasal congestion, purulent rhinorrhea, cough, fevers, sore throat , pain around both ears, glands and neck swollen. Medications taken so far OTC Dayquil and Nyquil with moderate relief.     ROS: Per HPI  Current Outpatient Medications:    Azelastine-Fluticasone 137-50 MCG/ACT SUSP, Place 1 spray into the nose every 12 (twelve) hours., Disp: 23 g, Rfl: 0   Fiber POWD, Take 1 Dose by mouth daily at 2 am. Prebiotic fiber supplement, Disp: , Rfl:    guaiFENesin (ROBITUSSIN) 100 MG/5ML liquid, Take 5 mLs by mouth every 4 (four) hours as needed for cough or to loosen phlegm., Disp: 120 mL, Rfl: 0   levocetirizine (XYZAL) 5 MG tablet, Take 1 tablet (5 mg total) by mouth once as needed for up to 1 dose for allergies., Disp: 10 tablet, Rfl: 0   lidocaine (XYLOCAINE) 2 % solution, Use as directed 15 mLs in the mouth or throat as needed for mouth pain., Disp: 100 mL,  Rfl: 0   loratadine (CLARITIN) 10 MG tablet, Take 10 mg by mouth daily., Disp: , Rfl:    Multiple Vitamins-Minerals (CENTRUM SILVER 50+WOMEN) TABS, Take 1 tablet by mouth daily at 6 (six) AM., Disp: , Rfl:    nirmatrelvir/ritonavir (PAXLOVID) 20 x 150 MG & 10 x '100MG'$  TABS, Take 3 tablets by mouth 2 (two) times daily for 5 days. (Take nirmatrelvir 150 mg two tablets twice daily for 5 days and ritonavir 100 mg one tablet twice daily for 5 days) Patient GFR is 70, Disp: 30 tablet, Rfl: 0   pantoprazole (PROTONIX) 40 MG tablet, Take 1 tablet (40 mg total) by mouth daily., Disp: 90 tablet, Rfl: 3   Propylene Glycol-Glycerin ([employer] ARTIFICIAL TEARS) 1-0.3 % SOLN, Apply 1 drop to eye daily. (Patient taking differently: Apply 1 drop to eye daily as needed.), Disp: 15 mL, Rfl: 0   rizatriptan (MAXALT) 10 MG tablet, Take 10 mg by mouth as needed for migraine. May repeat in 2 hours if needed, Disp: , Rfl:    rosuvastatin (CRESTOR) 10 MG tablet, Take 1 tablet (10 mg total) by mouth daily., Disp: 90 tablet, Rfl: 3   zolpidem (AMBIEN) 10 MG tablet, Take 10 mg by mouth at bedtime as needed., Disp: , Rfl:   Observations/Objective: Physical Exam Neurological:     Mental Status: She is alert.   No physical exam due to telehealth/video visit  Assessment and Plan: COVID-19 Assessment & Plan: Positive Covid test Started patient on Paxlovid 150 mg,  100 mg GFR 70 For symptoms relief, Azelatine-Fluticasone 137-50 mcg, Guaifenesin syrup '100mg'$ /33m liquid ,Xyzal 5 mg,Lidocaine 2% oral solution       Other orders -     nirmatrelvir/ritonavir; Take 3 tablets by mouth 2 (two) times daily for 5 days. (Take nirmatrelvir 150 mg two tablets twice daily for 5 days and ritonavir 100 mg one tablet twice daily for 5 days) Patient GFR is 70  Dispense: 30 tablet; Refill: 0 -     Lidocaine Viscous HCl; Use as directed 15 mLs in the mouth or throat as needed for mouth pain.  Dispense: 100 mL; Refill: 0 -     guaiFENesin; Take 5  mLs by mouth every 4 (four) hours as needed for cough or to loosen phlegm.  Dispense: 120 mL; Refill: 0 -     Levocetirizine Dihydrochloride; Take 1 tablet (5 mg total) by mouth once as needed for up to 1 dose for allergies.  Dispense: 10 tablet; Refill: 0 -     Azelastine-Fluticasone; Place 1 spray into the nose every 12 (twelve) hours.  Dispense: 23 g; Refill: 0    Follow Up Instructions: No follow-ups on file.   I discussed the assessment and treatment plan with the patient. The patient was provided an opportunity to ask questions, and all were answered. The patient agreed with the plan and demonstrated an understanding of the instructions.   The patient was advised to call back or seek an in-person evaluation if the symptoms worsen or if the condition fails to improve as anticipated.  The above assessment and management plan was discussed with the patient. The patient verbalized understanding of and has agreed to the management plan.   IRenard HamperORia Comment FNP

## 2022-09-14 DIAGNOSIS — U071 COVID-19: Secondary | ICD-10-CM | POA: Insufficient documentation

## 2022-09-14 NOTE — Assessment & Plan Note (Addendum)
Positive Covid test Started patient on Paxlovid 150 mg, 100 mg GFR 70 For symptoms relief, Azelatine-Fluticasone 137-50 mcg, Guaifenesin syrup '100mg'$ /36m liquid ,Xyzal 5 mg,Lidocaine 2% oral solution

## 2022-09-17 ENCOUNTER — Other Ambulatory Visit (HOSPITAL_COMMUNITY): Payer: Self-pay | Admitting: Endocrinology

## 2022-09-17 ENCOUNTER — Telehealth (INDEPENDENT_AMBULATORY_CARE_PROVIDER_SITE_OTHER): Payer: Medicare Other | Admitting: Neurology

## 2022-09-17 DIAGNOSIS — G43009 Migraine without aura, not intractable, without status migrainosus: Secondary | ICD-10-CM | POA: Diagnosis not present

## 2022-09-17 DIAGNOSIS — E041 Nontoxic single thyroid nodule: Secondary | ICD-10-CM

## 2022-09-17 MED ORDER — NURTEC 75 MG PO TBDP
75.0000 mg | ORAL_TABLET | ORAL | 11 refills | Status: DC | PRN
Start: 2022-09-17 — End: 2022-10-30

## 2022-09-17 NOTE — Patient Instructions (Signed)
I will send in the prescription for the Nurtec Take 1 tablet at onset of headache, max is 1 tablet in 24 hours  Meds ordered this encounter  Medications   Rimegepant Sulfate (NURTEC) 75 MG TBDP    Sig: Take 1 tablet (75 mg total) by mouth as needed (take 1 tablet for acute headache, max is 1 tablet in 24 hours).    Dispense:  8 tablet    Refill:  11

## 2022-09-17 NOTE — Progress Notes (Signed)
   Virtual Visit via Video Note  I connected with [name] on 09/17/22 at  2:15 PM EST by a video enabled telemedicine application and verified that I am speaking with the correct person using two identifiers.  Location: Patient: at her house Provider: in the office    I discussed the limitations of evaluation and management by telemedicine and the availability of in person appointments. The patient expressed understanding and agreed to proceed.  History of Present Illness: 09/17/22 SS: currently has COVID. Migraines are better. Have calmed down this winter. Nurtec has been approved. Treats migraines once a month. Maxalt helps but can cause rebound. [name] was not covered.   03/18/22 SS: [name] is here today for follow-up.  Stopped propanolol due to side effect of nausea, dizziness.  Migraine pattern continues to vary, last week she had a migraine for 5 days.  Maxalt is beneficial, but often times the headache rebounds, will take with Advil.  Her insurance would not cover Amerge.  She prefers to stay off preventative.  Headaches are bilateral retro-orbital, occipital pressure, occasionally will see sparkles.     Observations/Objective: Via virtual visit, is alert and oriented, speech is clear and concise, moves about freely  Assessment and Plan: 1.  Chronic migraine headache -Under good control -Will send in Nurtec as needed for acute headache, reports was approved, but no prescription on file -Previously tried and failed: Maxalt, [name] was not covered by insurance, propranolol, Imitrex, Amerge, Topamax  Follow Up Instructions: 1 year virtually   I discussed the assessment and treatment plan with the patient. The patient was provided an opportunity to ask questions and all were answered. The patient agreed with the plan and demonstrated an understanding of the instructions.   The patient was advised to call back or seek an in-person evaluation if the symptoms worsen or  if the condition fails to improve as anticipated.   Evangeline Dakin, DNP  [facility] [address], [place],  [address] [phone]

## 2022-09-23 ENCOUNTER — Ambulatory Visit (HOSPITAL_COMMUNITY)
Admission: RE | Admit: 2022-09-23 | Discharge: 2022-09-23 | Disposition: A | Discharge status: Home / Self Care | Payer: Medicare Other | Source: Ambulatory Visit | Attending: Endocrinology | Admitting: Endocrinology

## 2022-09-23 DIAGNOSIS — E041 Nontoxic single thyroid nodule: Secondary | ICD-10-CM | POA: Diagnosis not present

## 2022-10-09 LAB — HM MAMMOGRAPHY

## 2022-10-30 ENCOUNTER — Encounter: Payer: Self-pay | Admitting: Internal Medicine

## 2022-10-30 ENCOUNTER — Ambulatory Visit (INDEPENDENT_AMBULATORY_CARE_PROVIDER_SITE_OTHER): Payer: Medicare Other | Admitting: Internal Medicine

## 2022-10-30 VITALS — BP 137/87 | HR 68 | Ht 67.5 in | Wt 170.2 lb

## 2022-10-30 DIAGNOSIS — K219 Gastro-esophageal reflux disease without esophagitis: Secondary | ICD-10-CM | POA: Diagnosis not present

## 2022-10-30 DIAGNOSIS — G43709 Chronic migraine without aura, not intractable, without status migrainosus: Secondary | ICD-10-CM

## 2022-10-30 DIAGNOSIS — I83893 Varicose veins of bilateral lower extremities with other complications: Secondary | ICD-10-CM

## 2022-10-30 DIAGNOSIS — Z0001 Encounter for general adult medical examination with abnormal findings: Secondary | ICD-10-CM | POA: Diagnosis not present

## 2022-10-30 DIAGNOSIS — R1310 Dysphagia, unspecified: Secondary | ICD-10-CM

## 2022-10-30 DIAGNOSIS — E782 Mixed hyperlipidemia: Secondary | ICD-10-CM

## 2022-10-30 DIAGNOSIS — R739 Hyperglycemia, unspecified: Secondary | ICD-10-CM

## 2022-10-30 DIAGNOSIS — E041 Nontoxic single thyroid nodule: Secondary | ICD-10-CM

## 2022-10-30 DIAGNOSIS — H04123 Dry eye syndrome of bilateral lacrimal glands: Secondary | ICD-10-CM

## 2022-10-30 MED ORDER — RIZATRIPTAN BENZOATE 10 MG PO TABS: 10.0000 mg | ORAL_TABLET | ORAL | 3 refills | Status: AC | PRN

## 2022-10-30 NOTE — Assessment & Plan Note (Signed)
Stable sized thyroid nodule Check TSH Followed by Dr. Chalmers Cater

## 2022-10-30 NOTE — Assessment & Plan Note (Signed)
Physical exam as documented. Fasting blood tests today. Advised to get Shingrix and Tdap vaccines at local pharmacy.

## 2022-10-30 NOTE — Assessment & Plan Note (Signed)
On Maxalt (PRN) Follows up with Neurology

## 2022-10-30 NOTE — Assessment & Plan Note (Signed)
Artificial tears prescribed Advised to see local Optometrist for routine eye exam - does not need referral

## 2022-10-30 NOTE — Assessment & Plan Note (Signed)
Has bilateral varicose veins with mild leg swelling (intermittent) Followed by vascular surgery - [facility]

## 2022-10-30 NOTE — Assessment & Plan Note (Signed)
Lipid profile improved with Crestor

## 2022-10-30 NOTE — Assessment & Plan Note (Signed)
H/o esophageal stricture in the past, s/p balloon dilation Has had GI follow up Needs to eating small bites, chew food well

## 2022-10-30 NOTE — Patient Instructions (Signed)
Please continue to take medications as prescribed.  Please continue to follow heart healthy diet and perform moderate exercise/walking at least 150 mins/week.

## 2022-10-30 NOTE — Progress Notes (Signed)
Established Patient Office Visit  Subjective:  Patient ID: [name], female    DOB: Nov 04, 1955  Age: 67 y.o. MRN: [id number]  CC:  Chief Complaint  Patient presents with   Dysphagia    Follow up for dysphagia and HLD. Patient would like to be referred to an eye doctor in [place].    Annual Exam    HPI [name] is a 67 y.o. female with past medical history of osteoporosis, GERD, migraine and insomnia who presents for annual physical.  She has been having difficulty swallowing especially with solid food. She states that she has worse dysphagia with certain food, such as rice. She contacted GI yet for follow up for dysphagia.  She had EGD in 08/23, and dilation of esophageal stricture.  She had relief for only 3 months, and started having dysphagia again.  Her acid reflux is better with Pantoprazole and requests refills.  She has been taking Crestor for HLD. BP is well-controlled. Patient denies headache, dizziness, chest pain, dyspnea or palpitations.  She takes Maxalt as needed for migraine, needs it about 2-3 times in a month.  She has visual disturbance and nausea when she has migraine flareup.  Her neurologist had tried to give her [name] or Nurtec, but were not cost effective.  Past Medical History:  Diagnosis Date   Anxiety    Arthritis    BCC (basal cell carcinoma of skin) 11/22/1993   bcc left nasal bridge tx: curet, exc.   BCC (basal cell carcinoma of skin) 12/20/1993   bcc + margin left nasal bridge tx:exc.   Common migraine 08/01/2014   Depression    DIVERTICULITIS-COLON 07/04/2009   Qualifier: Diagnosis of   By: Trellis Paganini PA-c, Amy S      Diverticulosis    GERD (gastroesophageal reflux disease)    on meds   History of hepatitis A    Osteoporosis    SCC (squamous cell carcinoma) 05/11/2002   scc in situ bowens left cheek   Seasonal allergies    Thyroid nodule    per recent US, has enlarged, but being monitored now    Past Surgical History:   Procedure Laterality Date   ABDOMINAL HYSTERECTOMY N/A    [employer] 08/27/2020   BACK SURGERY  12/24/2006   Lumbosacral spine   COLONOSCOPY     NASAL SINUS SURGERY     neck injection N/A    rotator cuff surgery Right 11/25/2021   SPINE SURGERY N/A    [employer] 08/27/2020   thyroid lumpectomy  08/26/1979   TONSILLECTOMY     UPPER GASTROINTESTINAL ENDOSCOPY     VAGINAL HYSTERECTOMY      Family History  Problem Relation Age of Onset   Diabetes Mother    COPD Mother    Hypertension Mother    Heart attack Father    Migraines Father    Migraines Sister    Stomach cancer Maternal Grandmother    Other Other        brain tumor   Colon cancer Neg Hx    Esophageal cancer Neg Hx    Rectal cancer Neg Hx    Colon polyps Neg Hx     Social History   Socioeconomic History   Marital status: Married    Spouse name: Not on file   Number of children: 1   Years of education: Not on file   Highest education level: Not on file  Occupational History   Occupation: retired  Tobacco Use   Smoking  status: Former    Types: Cigarettes    Quit date: 08/25/1978    Years since quitting: 44.2   Smokeless tobacco: Never  Vaping Use   Vaping Use: Never used  Substance and Sexual Activity   Alcohol use: Not Currently    Alcohol/week: 0.0 - 2.0 standard drinks of alcohol   Drug use: No   Sexual activity: Yes    Birth control/protection: Surgical  Other Topics Concern   Not on file  Social History Narrative   Not on file   Social Determinants of Health   Financial Resource Strain: Low Risk  (05/07/2022)   Overall Financial Resource Strain (CARDIA)    Difficulty of Paying Living Expenses: Not hard at all  Food Insecurity: No Food Insecurity (05/07/2022)   Hunger Vital Sign    Worried About Running Out of Food in the Last Year: Never true    Ran Out of Food in the Last Year: Never true  Transportation Needs: No Transportation Needs (05/07/2022)   PRAPARE - Radiographer, therapeutic (Medical): No    Lack of Transportation (Non-Medical): No  Physical Activity: Sufficiently Active (05/07/2022)   Exercise Vital Sign    Days of Exercise per Week: 5 days    Minutes of Exercise per Session: 50 min  Stress: No Stress Concern Present (05/07/2022)   [place]    Feeling of Stress : Not at all  Social Connections: Moderately Integrated (05/07/2022)   Social Connection and Isolation Panel [NHANES]    Frequency of Communication with Friends and Family: More than three times a week    Frequency of Social Gatherings with Friends and Family: More than three times a week    Attends Religious Services: More than 4 times per year    Active Member of [employer] or Organizations: No    Attends Archivist Meetings: Never    Marital Status: Married  Human resources officer Violence: Not At Risk (05/07/2022)   Humiliation, Afraid, Rape, and Kick questionnaire    Fear of Current or Ex-Partner: No    Emotionally Abused: No    Physically Abused: No    Sexually Abused: No    Outpatient Medications Prior to Visit  Medication Sig Dispense Refill   Multiple Vitamins-Minerals (CENTRUM SILVER 50+WOMEN) TABS Take 1 tablet by mouth daily at 6 (six) AM.     pantoprazole (PROTONIX) 40 MG tablet Take 1 tablet (40 mg total) by mouth daily. 90 tablet 3   rosuvastatin (CRESTOR) 10 MG tablet Take 1 tablet (10 mg total) by mouth daily. 90 tablet 3   zolpidem (AMBIEN) 10 MG tablet Take 10 mg by mouth at bedtime as needed.     rizatriptan (MAXALT) 10 MG tablet Take 10 mg by mouth as needed for migraine. May repeat in 2 hours if needed     Fiber POWD Take 1 Dose by mouth daily at 2 am. Prebiotic fiber supplement     loratadine (CLARITIN) 10 MG tablet Take 10 mg by mouth daily.     Propylene Glycol-Glycerin ([employer] ARTIFICIAL TEARS) 1-0.3 % SOLN Apply 1 drop to eye daily. (Patient taking differently: Apply 1 drop to eye daily as  needed.) 15 mL 0   Azelastine-Fluticasone 137-50 MCG/ACT SUSP Place 1 spray into the nose every 12 (twelve) hours. 23 g 0   guaiFENesin (ROBITUSSIN) 100 MG/5ML liquid Take 5 mLs by mouth every 4 (four) hours as needed for cough or to loosen phlegm.  120 mL 0   levocetirizine (XYZAL) 5 MG tablet Take 1 tablet (5 mg total) by mouth once as needed for up to 1 dose for allergies. 10 tablet 0   lidocaine (XYLOCAINE) 2 % solution Use as directed 15 mLs in the mouth or throat as needed for mouth pain. 100 mL 0   Rimegepant Sulfate (NURTEC) 75 MG TBDP Take 1 tablet (75 mg total) by mouth as needed (take 1 tablet for acute headache, max is 1 tablet in 24 hours). 8 tablet 11   No facility-administered medications prior to visit.    Not on File  ROS Review of Systems  Constitutional:  Negative for chills and fever.  HENT:  Negative for congestion, sinus pressure, sinus pain and sore throat.   Eyes:  Negative for pain and discharge.       Dry eyes  Respiratory:  Negative for cough and shortness of breath.   Cardiovascular:  Negative for chest pain and palpitations.  Gastrointestinal:  Negative for abdominal pain, constipation, diarrhea, nausea and vomiting.       Dysphagia  Endocrine: Negative for polydipsia and polyuria.  Genitourinary:  Negative for dysuria and hematuria.  Musculoskeletal:  Negative for neck pain and neck stiffness.  Skin:  Negative for rash.  Neurological:  Negative for dizziness and weakness.  Psychiatric/Behavioral:  Negative for agitation and behavioral problems.       Objective:    Physical Exam Vitals reviewed.  Constitutional:      General: She is not in acute distress.    Appearance: She is not diaphoretic.  HENT:     Head: Normocephalic and atraumatic.     Nose: Nose normal.     Mouth/Throat:     Mouth: Mucous membranes are moist.  Eyes:     General: No scleral icterus.    Extraocular Movements: Extraocular movements intact.  Cardiovascular:     Rate and  Rhythm: Normal rate and regular rhythm.     Pulses: Normal pulses.     Heart sounds: Normal heart sounds. No murmur heard. Pulmonary:     Breath sounds: Normal breath sounds. No wheezing or rales.  Abdominal:     Palpations: Abdomen is soft.     Tenderness: There is no abdominal tenderness.  Musculoskeletal:     Cervical back: Neck supple. No tenderness.     Right lower leg: No edema.     Left lower leg: No edema.  Skin:    General: Skin is warm.     Findings: No rash.     Comments: 2 soft tissue mass over right thigh, lipoma-like, around 0.5 cm in diameter each. No warmth or erythema 1 verrucous growth over right leg, near knee on lateral side, whitish, about 1 cm in length  Neurological:     General: No focal deficit present.     Mental Status: She is alert and oriented to person, place, and time.  Psychiatric:        Mood and Affect: Mood normal.        Behavior: Behavior normal.     BP 137/87 (BP Location: Right Arm, Patient Position: Sitting, Cuff Size: Normal)   Pulse 68   Ht 5' 7.5" (1.715 m)   Wt 170 lb 3.2 oz (77.2 kg)   SpO2 98%   BMI 26.26 kg/m  Wt Readings from Last 3 Encounters:  10/30/22 170 lb 3.2 oz (77.2 kg)  09/11/22 168 lb (76.2 kg)  04/18/22 168 lb (76.2 kg)    Lab  Results  Component Value Date   TSH 1.680 08/28/2020   Lab Results  Component Value Date   WBC 5.8 04/14/2022   HGB 13.2 04/14/2022   HCT 39.6 04/14/2022   MCV 90 04/14/2022   PLT 286 04/14/2022   Lab Results  Component Value Date   NA 143 04/14/2022   K 4.8 04/14/2022   CO2 25 04/14/2022   GLUCOSE 88 04/14/2022   BUN 19 04/14/2022   CREATININE 0.91 04/14/2022   BILITOT 0.4 04/14/2022   ALKPHOS 71 04/14/2022   AST 20 04/14/2022   ALT 11 04/14/2022   PROT 6.8 04/14/2022   ALBUMIN 4.7 04/14/2022   CALCIUM 10.0 04/14/2022   EGFR 70 04/14/2022   Lab Results  Component Value Date   CHOL 190 04/14/2022   Lab Results  Component Value Date   HDL 70 04/14/2022   Lab  Results  Component Value Date   LDLCALC 106 (H) 04/14/2022   Lab Results  Component Value Date   TRIG 79 04/14/2022   Lab Results  Component Value Date   CHOLHDL 2.7 04/14/2022   Lab Results  Component Value Date   HGBA1C 5.3 08/28/2020      Assessment & Plan:   Problem List Items Addressed This Visit       Cardiovascular and Mediastinum   Migraine    On Maxalt (PRN) Follows up with Neurology      Relevant Medications   rizatriptan (MAXALT) 10 MG tablet   Other Relevant Orders   TSH   CMP14+EGFR   CBC with Differential/Platelet   Varicose veins of both legs with edema    Has bilateral varicose veins with mild leg swelling (intermittent) Followed by vascular surgery - [facility]        Digestive   Gastroesophageal reflux disease    Well controlled with pantoprazole      Dysphagia    H/o esophageal stricture in the past, s/p balloon dilation Has had GI follow up Needs to eating small bites, chew food well        Endocrine   Nontoxic single thyroid nodule    Stable sized thyroid nodule Check TSH Followed by Dr. Chalmers Cater      Relevant Orders   TSH     Other   Dry eyes    Artificial tears prescribed Advised to see local Optometrist for routine eye exam - does not need referral      Mixed hyperlipidemia    Lipid profile improved with Crestor      Relevant Orders   Lipid panel   Encounter for general adult medical examination with abnormal findings - Primary    Physical exam as documented. Fasting blood tests today. Advised to get Shingrix and Tdap vaccines at local pharmacy.      Relevant Orders   CBC with Differential/Platelet   Other Visit Diagnoses     Hyperglycemia       Relevant Orders   Hemoglobin A1c       Meds ordered this encounter  Medications   rizatriptan (MAXALT) 10 MG tablet    Sig: Take 1 tablet (10 mg total) by mouth as needed for migraine. May repeat in 2 hours if needed    Dispense:  10 tablet     Refill:  3    Follow-up: Return in about 6 months (around 05/02/2023) for Migraine and HLD.    Lindell Spar, MD

## 2022-10-30 NOTE — Assessment & Plan Note (Signed)
Well controlled with pantoprazole

## 2022-10-31 ENCOUNTER — Other Ambulatory Visit: Payer: Self-pay | Admitting: Internal Medicine

## 2022-10-31 DIAGNOSIS — K219 Gastro-esophageal reflux disease without esophagitis: Secondary | ICD-10-CM

## 2022-10-31 LAB — CBC WITH DIFFERENTIAL/PLATELET
Basophils Absolute: 0.1 10*3/uL (ref 0.0–0.2)
Basos: 1 %
EOS (ABSOLUTE): 0.2 10*3/uL (ref 0.0–0.4)
Eos: 3 %
Hematocrit: 38.1 % (ref 34.0–46.6)
Hemoglobin: 12.4 g/dL (ref 11.1–15.9)
Immature Grans (Abs): 0 10*3/uL (ref 0.0–0.1)
Immature Granulocytes: 0 %
Lymphocytes Absolute: 2.1 10*3/uL (ref 0.7–3.1)
Lymphs: 40 %
MCH: 29.5 pg (ref 26.6–33.0)
MCHC: 32.5 g/dL (ref 31.5–35.7)
MCV: 91 fL (ref 79–97)
Monocytes Absolute: 0.4 10*3/uL (ref 0.1–0.9)
Monocytes: 8 %
Neutrophils Absolute: 2.5 10*3/uL (ref 1.4–7.0)
Neutrophils: 48 %
Platelets: 295 10*3/uL (ref 150–450)
RBC: 4.2 x10E6/uL (ref 3.77–5.28)
RDW: 13.2 % (ref 11.7–15.4)
WBC: 5.3 10*3/uL (ref 3.4–10.8)

## 2022-10-31 LAB — CMP14+EGFR
ALT: 14 IU/L (ref 0–32)
AST: 20 IU/L (ref 0–40)
Albumin/Globulin Ratio: 2.3 — ABNORMAL HIGH (ref 1.2–2.2)
Albumin: 4.6 g/dL (ref 3.9–4.9)
Alkaline Phosphatase: 70 IU/L (ref 44–121)
BUN/Creatinine Ratio: 16 (ref 12–28)
BUN: 15 mg/dL (ref 8–27)
Bilirubin Total: 0.3 mg/dL (ref 0.0–1.2)
CO2: 22 mmol/L (ref 20–29)
Calcium: 9.5 mg/dL (ref 8.7–10.3)
Chloride: 107 mmol/L — ABNORMAL HIGH (ref 96–106)
Creatinine, Ser: 0.93 mg/dL (ref 0.57–1.00)
Globulin, Total: 2 g/dL (ref 1.5–4.5)
Glucose: 98 mg/dL (ref 70–99)
Potassium: 5.3 mmol/L — ABNORMAL HIGH (ref 3.5–5.2)
Sodium: 144 mmol/L (ref 134–144)
Total Protein: 6.6 g/dL (ref 6.0–8.5)
eGFR: 67 mL/min/{1.73_m2} (ref >59)

## 2022-10-31 LAB — TSH: TSH: 1.91 u[IU]/mL (ref 0.450–4.500)

## 2022-10-31 LAB — LIPID PANEL
Chol/HDL Ratio: 2.8 ratio (ref 0.0–4.4)
Cholesterol, Total: 180 mg/dL (ref 100–199)
HDL: 64 mg/dL (ref >39)
LDL Chol Calc (NIH): 103 mg/dL — ABNORMAL HIGH (ref 0–99)
Triglycerides: 68 mg/dL (ref 0–149)
VLDL Cholesterol Cal: 13 mg/dL (ref 5–40)

## 2022-10-31 LAB — HEMOGLOBIN A1C
Est. average glucose Bld gHb Est-mCnc: 120 mg/dL
Hgb A1c MFr Bld: 5.8 % — ABNORMAL HIGH (ref 4.8–5.6)

## 2022-11-19 ENCOUNTER — Other Ambulatory Visit: Payer: Self-pay | Admitting: Internal Medicine

## 2022-11-19 DIAGNOSIS — E782 Mixed hyperlipidemia: Secondary | ICD-10-CM

## 2023-01-05 ENCOUNTER — Ambulatory Visit (INDEPENDENT_AMBULATORY_CARE_PROVIDER_SITE_OTHER): Payer: Medicare Other | Admitting: Internal Medicine

## 2023-01-05 VITALS — BP 126/83 | HR 86 | Ht 67.5 in | Wt 164.6 lb

## 2023-01-05 DIAGNOSIS — J0140 Acute pansinusitis, unspecified: Secondary | ICD-10-CM | POA: Insufficient documentation

## 2023-01-05 MED ORDER — NOREL AD 4-10-325 MG PO TABS: 1.0000 | ORAL_TABLET | Freq: Three times a day (TID) | ORAL | 0 refills | Status: DC | PRN

## 2023-01-05 MED ORDER — AMOXICILLIN-POT CLAVULANATE 875-125 MG PO TABS: 1.0000 | ORAL_TABLET | Freq: Two times a day (BID) | ORAL | 0 refills | Status: DC

## 2023-01-05 MED ORDER — BENZONATATE 200 MG PO CAPS: 200.0000 mg | ORAL_CAPSULE | Freq: Two times a day (BID) | ORAL | 0 refills | Status: DC | PRN

## 2023-01-05 NOTE — Assessment & Plan Note (Signed)
Started empiric Augmentin Norel AD as needed for nasal congestion Tessalon as needed for cough Advised to use humidifier and/or vaporizer for nasal congestion

## 2023-01-05 NOTE — Patient Instructions (Signed)
Please start taking Augmentin as prescribed.  Take Norel AD for nasal congestion.  Please take Tessalon as prescribed for cough.

## 2023-01-05 NOTE — Progress Notes (Signed)
Acute Office Visit  Subjective:    Patient ID: [name], female    DOB: 1955/09/19, 67 y.o.   MRN: [id number]  Chief Complaint  Patient presents with   Sinusitis    Patient has sinus infection, mucus, sore throat, fatigue, headache, pressure in ears, cough.    HPI Patient is in today for complaint of nasal congestion, sinus pressure related headache, postnasal drip, pressure in bilateral ears, sore throat and cough for the last 2 weeks.  She had an episode of fever in the last week.  Denies any wheezing.  She has tried DayQuil/NyQuil and Alka-Seltzer sinus medicine with no relief.  Past Medical History:  Diagnosis Date   Anxiety    Arthritis    BCC (basal cell carcinoma of skin) 11/22/1993   bcc left nasal bridge tx: curet, exc.   BCC (basal cell carcinoma of skin) 12/20/1993   bcc + margin left nasal bridge tx:exc.   Common migraine 08/01/2014   Depression    DIVERTICULITIS-COLON 07/04/2009   Qualifier: Diagnosis of   By: Monica Becton PA-c, Amy S      Diverticulosis    GERD (gastroesophageal reflux disease)    on meds   History of hepatitis A    Osteoporosis    SCC (squamous cell carcinoma) 05/11/2002   scc in situ bowens left cheek   Seasonal allergies    Thyroid nodule    per recent US, has enlarged, but being monitored now    Past Surgical History:  Procedure Laterality Date   ABDOMINAL HYSTERECTOMY N/A    [facility] 08/27/2020   BACK SURGERY  12/24/2006   Lumbosacral spine   COLONOSCOPY     NASAL SINUS SURGERY     neck injection N/A    rotator cuff surgery Right 11/25/2021   SPINE SURGERY N/A    [facility] 08/27/2020   thyroid lumpectomy  08/26/1979   TONSILLECTOMY     UPPER GASTROINTESTINAL ENDOSCOPY     VAGINAL HYSTERECTOMY      Family History  Problem Relation Age of Onset   Diabetes Mother    COPD Mother    Hypertension Mother    Heart attack Father    Migraines Father    Migraines Sister    Stomach cancer Maternal Grandmother    Other  Other        brain tumor   Colon cancer Neg Hx    Esophageal cancer Neg Hx    Rectal cancer Neg Hx    Colon polyps Neg Hx     Social History   Socioeconomic History   Marital status: Married    Spouse name: Not on file   Number of children: 1   Years of education: Not on file   Highest education level: Associate degree: occupational, Scientist, product/process development, or vocational program  Occupational History   Occupation: retired  Tobacco Use   Smoking status: Former    Types: Cigarettes    Quit date: 08/25/1978    Years since quitting: 44.3   Smokeless tobacco: Never  Vaping Use   Vaping Use: Never used  Substance and Sexual Activity   Alcohol use: Not Currently    Alcohol/week: 0.0 - 2.0 standard drinks of alcohol   Drug use: No   Sexual activity: Yes    Birth control/protection: Surgical  Other Topics Concern   Not on file  Social History Narrative   Not on file   Social Determinants of Health   Financial Resource Strain: Low Risk  (01/05/2023)  Overall Financial Resource Strain (CARDIA)    Difficulty of Paying Living Expenses: Not hard at all  Food Insecurity: No Food Insecurity (01/05/2023)   Hunger Vital Sign    Worried About Running Out of Food in the Last Year: Never true    Ran Out of Food in the Last Year: Never true  Transportation Needs: No Transportation Needs (01/05/2023)   PRAPARE - Administrator, Civil Service (Medical): No    Lack of Transportation (Non-Medical): No  Physical Activity: Insufficiently Active (01/05/2023)   Exercise Vital Sign    Days of Exercise per Week: 4 days    Minutes of Exercise per Session: 30 min  Stress: Stress Concern Present (01/05/2023)   [employer] of Occupational Health - Occupational Stress Questionnaire    Feeling of Stress : To some extent  Social Connections: Socially Integrated (01/05/2023)   Social Connection and Isolation Panel [NHANES]    Frequency of Communication with Friends and Family: Three times a week     Frequency of Social Gatherings with Friends and Family: Once a week    Attends Religious Services: More than 4 times per year    Active Member of [employer] or Organizations: Yes    Attends Banker Meetings: 1 to 4 times per year    Marital Status: Married  Catering manager Violence: Not At Risk (05/07/2022)   Humiliation, Afraid, Rape, and Kick questionnaire    Fear of Current or Ex-Partner: No    Emotionally Abused: No    Physically Abused: No    Sexually Abused: No    Outpatient Medications Prior to Visit  Medication Sig Dispense Refill   Fiber POWD Take 1 Dose by mouth daily at 2 am. Prebiotic fiber supplement     loratadine (CLARITIN) 10 MG tablet Take 10 mg by mouth daily.     Multiple Vitamins-Minerals (CENTRUM SILVER 50+WOMEN) TABS Take 1 tablet by mouth daily at 6 (six) AM.     pantoprazole (PROTONIX) 40 MG tablet TAKE 1 TABLET BY MOUTH EVERY DAY 90 tablet 3   Propylene Glycol-Glycerin ([employer] ARTIFICIAL TEARS) 1-0.3 % SOLN Apply 1 drop to eye daily. (Patient taking differently: Apply 1 drop to eye daily as needed.) 15 mL 0   rizatriptan (MAXALT) 10 MG tablet Take 1 tablet (10 mg total) by mouth as needed for migraine. May repeat in 2 hours if needed 10 tablet 3   rosuvastatin (CRESTOR) 10 MG tablet TAKE 1 TABLET BY MOUTH EVERY DAY 90 tablet 3   zolpidem (AMBIEN) 10 MG tablet Take 10 mg by mouth at bedtime as needed.     No facility-administered medications prior to visit.    Not on File  Review of Systems  Constitutional:  Positive for fatigue and fever. Negative for chills.  HENT:  Positive for congestion, sinus pressure, sinus pain and sore throat.   Eyes:  Negative for pain and discharge.       Dry eyes  Respiratory:  Positive for cough. Negative for wheezing.   Cardiovascular:  Negative for chest pain and palpitations.  Gastrointestinal:  Negative for abdominal pain, diarrhea, nausea and vomiting.       Dysphagia  Endocrine: Negative for polydipsia and  polyuria.  Genitourinary:  Negative for dysuria and hematuria.  Musculoskeletal:  Negative for neck pain and neck stiffness.  Skin:  Negative for rash.  Neurological:  Negative for dizziness and weakness.  Psychiatric/Behavioral:  Negative for agitation and behavioral problems.  Objective:    Physical Exam Constitutional:      General: She is not in acute distress.    Appearance: She is not diaphoretic.  HENT:     Head: Normocephalic and atraumatic.     Nose: Congestion present.     Right Sinus: Maxillary sinus tenderness and frontal sinus tenderness present.     Left Sinus: Maxillary sinus tenderness and frontal sinus tenderness present.     Mouth/Throat:     Mouth: Mucous membranes are moist.     Pharynx: Posterior oropharyngeal erythema present.  Eyes:     General: No scleral icterus.    Extraocular Movements: Extraocular movements intact.  Cardiovascular:     Rate and Rhythm: Normal rate and regular rhythm.     Heart sounds: Normal heart sounds. No murmur heard. Pulmonary:     Breath sounds: Normal breath sounds. No wheezing or rales.  Musculoskeletal:     Right lower leg: No edema.     Left lower leg: No edema.  Skin:    General: Skin is warm.     Findings: No rash.  Neurological:     General: No focal deficit present.     Mental Status: She is alert and oriented to person, place, and time.  Psychiatric:        Mood and Affect: Mood normal.        Behavior: Behavior normal.     BP 126/83 (BP Location: Right Arm, Patient Position: Sitting, Cuff Size: Normal)   Pulse 86   Ht 5' 7.5" (1.715 m)   Wt 164 lb 9.6 oz (74.7 kg)   SpO2 97%   BMI 25.40 kg/m  Wt Readings from Last 3 Encounters:  01/05/23 164 lb 9.6 oz (74.7 kg)  10/30/22 170 lb 3.2 oz (77.2 kg)  09/11/22 168 lb (76.2 kg)        Assessment & Plan:   Problem List Items Addressed This Visit       Respiratory   Acute non-recurrent pansinusitis - Primary    Started empiric  Augmentin Norel AD as needed for nasal congestion Tessalon as needed for cough Advised to use humidifier and/or vaporizer for nasal congestion      Relevant Medications   amoxicillin-clavulanate (AUGMENTIN) 875-125 MG tablet   Chlorphen-PE-Acetaminophen (NOREL AD) 4-10-325 MG TABS   benzonatate (TESSALON) 200 MG capsule     Meds ordered this encounter  Medications   amoxicillin-clavulanate (AUGMENTIN) 875-125 MG tablet    Sig: Take 1 tablet by mouth 2 (two) times daily.    Dispense:  14 tablet    Refill:  0   Chlorphen-PE-Acetaminophen (NOREL AD) 4-10-325 MG TABS    Sig: Take 1 tablet by mouth 3 (three) times daily as needed.    Dispense:  30 tablet    Refill:  0   benzonatate (TESSALON) 200 MG capsule    Sig: Take 1 capsule (200 mg total) by mouth 2 (two) times daily as needed for cough.    Dispense:  20 capsule    Refill:  0     Kalijah Zeiss Concha Se, MD

## 2023-05-04 ENCOUNTER — Encounter: Payer: Self-pay | Admitting: Internal Medicine

## 2023-05-04 ENCOUNTER — Ambulatory Visit (INDEPENDENT_AMBULATORY_CARE_PROVIDER_SITE_OTHER): Payer: Medicare Other | Admitting: Internal Medicine

## 2023-05-04 VITALS — BP 121/82 | HR 81 | Ht 67.5 in | Wt 165.4 lb

## 2023-05-04 DIAGNOSIS — K219 Gastro-esophageal reflux disease without esophagitis: Secondary | ICD-10-CM | POA: Diagnosis not present

## 2023-05-04 DIAGNOSIS — G43709 Chronic migraine without aura, not intractable, without status migrainosus: Secondary | ICD-10-CM

## 2023-05-04 DIAGNOSIS — E875 Hyperkalemia: Secondary | ICD-10-CM

## 2023-05-04 DIAGNOSIS — E782 Mixed hyperlipidemia: Secondary | ICD-10-CM

## 2023-05-04 DIAGNOSIS — M810 Age-related osteoporosis without current pathological fracture: Secondary | ICD-10-CM | POA: Diagnosis not present

## 2023-05-04 DIAGNOSIS — R1310 Dysphagia, unspecified: Secondary | ICD-10-CM

## 2023-05-04 DIAGNOSIS — G4700 Insomnia, unspecified: Secondary | ICD-10-CM

## 2023-05-04 DIAGNOSIS — L570 Actinic keratosis: Secondary | ICD-10-CM

## 2023-05-04 NOTE — Assessment & Plan Note (Signed)
Takes Ambien 5 mg PRN, gets it through Ob.Gyn.

## 2023-05-04 NOTE — Progress Notes (Signed)
Established Patient Office Visit  Subjective:  Patient ID: [name], female    DOB: August 12, 1956  Age: 67 y.o. MRN: [id number]  CC:  Chief Complaint  Patient presents with   Migraine    Follow up    Hyperlipidemia    Follow up     HPI [name] is a 67 y.o. female with past medical history of osteoporosis, GERD, migraine and insomnia who presents for f/u of her chronic medical conditions.  She has difficulty swallowing especially with solid food sometimes. She states that she has worse dysphagia with certain food, such as rice. She had GI follow up for dysphagia.  She had EGD in 08/23, and dilation of esophageal stricture.  She had relief for only 3 months, and started having dysphagia again. She tries to chew her food well to avoid chocking sensation. Her acid reflux is better with Pantoprazole.  She has been taking Crestor for HLD. BP is well-controlled. Patient denies headache, dizziness, chest pain, dyspnea or palpitations.   She takes Maxalt as needed for migraine, needs it about 2-3 times in a month.  She has visual disturbance and nausea when she has migraine flareup.  Her neurologist had tried to give her Bernita Raisin or Nurtec, but were not cost effective.    Past Medical History:  Diagnosis Date   Anxiety    Arthritis    BCC (basal cell carcinoma of skin) 11/22/1993   bcc left nasal bridge tx: curet, exc.   BCC (basal cell carcinoma of skin) 12/20/1993   bcc + margin left nasal bridge tx:exc.   Common migraine 08/01/2014   Depression    DIVERTICULITIS-COLON 07/04/2009   Qualifier: Diagnosis of   By: Monica Becton PA-c, Amy S      Diverticulosis    GERD (gastroesophageal reflux disease)    on meds   History of hepatitis A    Osteoporosis    SCC (squamous cell carcinoma) 05/11/2002   scc in situ bowens left cheek   Seasonal allergies    Thyroid nodule    per recent US, has enlarged, but being monitored now    Past Surgical History:  Procedure Laterality  Date   ABDOMINAL HYSTERECTOMY N/A    [facility] 08/27/2020   BACK SURGERY  12/24/2006   Lumbosacral spine   COLONOSCOPY     NASAL SINUS SURGERY     neck injection N/A    rotator cuff surgery Right 11/25/2021   SPINE SURGERY N/A    [facility] 08/27/2020   thyroid lumpectomy  08/26/1979   TONSILLECTOMY     UPPER GASTROINTESTINAL ENDOSCOPY     VAGINAL HYSTERECTOMY      Family History  Problem Relation Age of Onset   Diabetes Mother    COPD Mother    Hypertension Mother    Heart attack Father    Migraines Father    Migraines Sister    Stomach cancer Maternal Grandmother    Other Other        brain tumor   Colon cancer Neg Hx    Esophageal cancer Neg Hx    Rectal cancer Neg Hx    Colon polyps Neg Hx     Social History   Socioeconomic History   Marital status: Married    Spouse name: Not on file   Number of children: 1   Years of education: Not on file   Highest education level: Associate degree: occupational, Scientist, product/process development, or vocational program  Occupational History   Occupation: retired  Tobacco  Use   Smoking status: Former    Current packs/day: 0.00    Types: Cigarettes    Quit date: 08/25/1978    Years since quitting: 44.7   Smokeless tobacco: Never  Vaping Use   Vaping status: Never Used  Substance and Sexual Activity   Alcohol use: Not Currently    Alcohol/week: 0.0 - 2.0 standard drinks of alcohol   Drug use: No   Sexual activity: Yes    Birth control/protection: Surgical  Other Topics Concern   Not on file  Social History Narrative   Not on file   Social Determinants of Health   Financial Resource Strain: Low Risk  (01/05/2023)   Overall Financial Resource Strain (CARDIA)    Difficulty of Paying Living Expenses: Not hard at all  Food Insecurity: No Food Insecurity (01/05/2023)   Hunger Vital Sign    Worried About Running Out of Food in the Last Year: Never true    Ran Out of Food in the Last Year: Never true  Transportation Needs: No Transportation  Needs (01/05/2023)   PRAPARE - Administrator, Civil Service (Medical): No    Lack of Transportation (Non-Medical): No  Physical Activity: Insufficiently Active (01/05/2023)   Exercise Vital Sign    Days of Exercise per Week: 4 days    Minutes of Exercise per Session: 30 min  Stress: Stress Concern Present (01/05/2023)   [employer] of Occupational Health - Occupational Stress Questionnaire    Feeling of Stress : To some extent  Social Connections: Socially Integrated (01/05/2023)   Social Connection and Isolation Panel [NHANES]    Frequency of Communication with Friends and Family: Three times a week    Frequency of Social Gatherings with Friends and Family: Once a week    Attends Religious Services: More than 4 times per year    Active Member of [employer] or Organizations: Yes    Attends Banker Meetings: 1 to 4 times per year    Marital Status: Married  Catering manager Violence: Not At Risk (05/07/2022)   Humiliation, Afraid, Rape, and Kick questionnaire    Fear of Current or Ex-Partner: No    Emotionally Abused: No    Physically Abused: No    Sexually Abused: No    Outpatient Medications Prior to Visit  Medication Sig Dispense Refill   Fiber POWD Take 1 Dose by mouth daily at 2 am. Prebiotic fiber supplement     loratadine (CLARITIN) 10 MG tablet Take 10 mg by mouth daily.     Multiple Vitamins-Minerals (CENTRUM SILVER 50+WOMEN) TABS Take 1 tablet by mouth daily at 6 (six) AM.     pantoprazole (PROTONIX) 40 MG tablet TAKE 1 TABLET BY MOUTH EVERY DAY 90 tablet 3   Propylene Glycol-Glycerin ([employer] ARTIFICIAL TEARS) 1-0.3 % SOLN Apply 1 drop to eye daily. (Patient taking differently: Apply 1 drop to eye daily as needed.) 15 mL 0   rizatriptan (MAXALT) 10 MG tablet Take 1 tablet (10 mg total) by mouth as needed for migraine. May repeat in 2 hours if needed 10 tablet 3   rosuvastatin (CRESTOR) 10 MG tablet TAKE 1 TABLET BY MOUTH EVERY DAY 90 tablet 3   zolpidem  (AMBIEN) 10 MG tablet Take 10 mg by mouth at bedtime as needed.     amoxicillin-clavulanate (AUGMENTIN) 875-125 MG tablet Take 1 tablet by mouth 2 (two) times daily. 14 tablet 0   benzonatate (TESSALON) 200 MG capsule Take 1 capsule (200 mg total) by mouth  2 (two) times daily as needed for cough. 20 capsule 0   Chlorphen-PE-Acetaminophen (NOREL AD) 4-10-325 MG TABS Take 1 tablet by mouth 3 (three) times daily as needed. 30 tablet 0   No facility-administered medications prior to visit.    Not on File  ROS Review of Systems  Constitutional:  Negative for chills and fever.  HENT:  Negative for congestion, sinus pressure, sinus pain and sore throat.   Eyes:  Negative for pain and discharge.       Dry eyes  Respiratory:  Negative for cough and shortness of breath.   Cardiovascular:  Negative for chest pain and palpitations.  Gastrointestinal:  Negative for abdominal pain, constipation, diarrhea, nausea and vomiting.       Dysphagia  Endocrine: Negative for polydipsia and polyuria.  Genitourinary:  Negative for dysuria and hematuria.  Musculoskeletal:  Negative for neck pain and neck stiffness.  Skin:  Negative for rash.  Neurological:  Negative for dizziness and weakness.  Psychiatric/Behavioral:  Negative for agitation and behavioral problems.       Objective:    Physical Exam Vitals reviewed.  Constitutional:      General: She is not in acute distress.    Appearance: She is not diaphoretic.  HENT:     Head: Normocephalic and atraumatic.     Nose: Nose normal.     Mouth/Throat:     Mouth: Mucous membranes are moist.  Eyes:     General: No scleral icterus.    Extraocular Movements: Extraocular movements intact.  Cardiovascular:     Rate and Rhythm: Normal rate and regular rhythm.     Pulses: Normal pulses.     Heart sounds: Normal heart sounds. No murmur heard. Pulmonary:     Breath sounds: Normal breath sounds. No wheezing or rales.  Musculoskeletal:     Cervical back:  Neck supple. No tenderness.     Right lower leg: No edema.     Left lower leg: No edema.  Skin:    General: Skin is warm.     Findings: No rash.     Comments: 2 soft tissue mass over right thigh, lipoma-like, around 0.5 cm in diameter each. No warmth or erythema 1 verrucous growth over right leg, near knee on lateral side, whitish, about 1 cm in length  Neurological:     General: No focal deficit present.     Mental Status: She is alert and oriented to person, place, and time.  Psychiatric:        Mood and Affect: Mood normal.        Behavior: Behavior normal.     BP 121/82 (BP Location: Right Arm, Patient Position: Sitting, Cuff Size: Normal)   Pulse 81   Ht 5' 7.5" (1.715 m)   Wt 165 lb 6.4 oz (75 kg)   SpO2 95%   BMI 25.52 kg/m  Wt Readings from Last 3 Encounters:  05/04/23 165 lb 6.4 oz (75 kg)  01/05/23 164 lb 9.6 oz (74.7 kg)  10/30/22 170 lb 3.2 oz (77.2 kg)    Lab Results  Component Value Date   TSH 1.910 10/30/2022   Lab Results  Component Value Date   WBC 5.3 10/30/2022   HGB 12.4 10/30/2022   HCT 38.1 10/30/2022   MCV 91 10/30/2022   PLT 295 10/30/2022   Lab Results  Component Value Date   NA 143 05/04/2023   K 4.3 05/04/2023   CO2 23 05/04/2023   GLUCOSE 90 05/04/2023  BUN 16 05/04/2023   CREATININE 0.96 05/04/2023   BILITOT 0.3 10/30/2022   ALKPHOS 70 10/30/2022   AST 20 10/30/2022   ALT 14 10/30/2022   PROT 6.6 10/30/2022   ALBUMIN 4.6 10/30/2022   CALCIUM 9.5 05/04/2023   EGFR 65 05/04/2023   Lab Results  Component Value Date   CHOL 180 10/30/2022   Lab Results  Component Value Date   HDL 64 10/30/2022   Lab Results  Component Value Date   LDLCALC 103 (H) 10/30/2022   Lab Results  Component Value Date   TRIG 68 10/30/2022   Lab Results  Component Value Date   CHOLHDL 2.8 10/30/2022   Lab Results  Component Value Date   HGBA1C 5.8 (H) 10/30/2022      Assessment & Plan:   Problem List Items Addressed This Visit        Cardiovascular and Mediastinum   Migraine    On Maxalt (PRN) Follows up with Neurology        Digestive   Gastroesophageal reflux disease - Primary    Well controlled with pantoprazole      Dysphagia    H/o esophageal stricture in the past, s/p balloon dilation Has had GI follow up Has small bites, chews food well        Musculoskeletal and Integument   Osteoporosis    Was on Fosamax Advised to take Vitamin D 2000 IU for now Had DEXA scan recently, will try to obtain records from OB/GYN      Actinic keratosis    Skin lesion near right knee likely actinic keratosis, benign        Other   Insomnia    Takes Ambien 5 mg PRN, gets it through Ob.Gyn.      Mixed hyperlipidemia    Lipid profile improved with Crestor      Hyperkalemia    Last CMP showed hyperkalemia, could be due to potassium rich food Recheck BMP      Relevant Orders   Basic Metabolic Panel (BMET) (Completed)    No orders of the defined types were placed in this encounter.   Follow-up: Return in about 6 months (around 11/01/2023) for Annual physical (after 10/30/23).    Anabel Halon, MD

## 2023-05-04 NOTE — Assessment & Plan Note (Signed)
On Maxalt (PRN) Follows up with Neurology

## 2023-05-04 NOTE — Assessment & Plan Note (Addendum)
H/o esophageal stricture in the past, s/p balloon dilation Has had GI follow up Has small bites, chews food well

## 2023-05-04 NOTE — Patient Instructions (Signed)
Please continue to take medications as prescribed.  Please continue to follow heart healthy diet and perform moderate exercise/walking at least 150 mins/week.  

## 2023-05-04 NOTE — Assessment & Plan Note (Signed)
Well-controlled with pantoprazole ?

## 2023-05-04 NOTE — Assessment & Plan Note (Signed)
Was on Fosamax ?Advised to take Vitamin D 2000 IU for now ?Had DEXA scan recently, will try to obtain records from OB/GYN ?

## 2023-05-05 LAB — BASIC METABOLIC PANEL
BUN/Creatinine Ratio: 17 (ref 12–28)
BUN: 16 mg/dL (ref 8–27)
CO2: 23 mmol/L (ref 20–29)
Calcium: 9.5 mg/dL (ref 8.7–10.3)
Chloride: 104 mmol/L (ref 96–106)
Creatinine, Ser: 0.96 mg/dL (ref 0.57–1.00)
Glucose: 90 mg/dL (ref 70–99)
Potassium: 4.3 mmol/L (ref 3.5–5.2)
Sodium: 143 mmol/L (ref 134–144)
eGFR: 65 mL/min/{1.73_m2} (ref >59)

## 2023-05-08 DIAGNOSIS — L570 Actinic keratosis: Secondary | ICD-10-CM | POA: Insufficient documentation

## 2023-05-08 DIAGNOSIS — E875 Hyperkalemia: Secondary | ICD-10-CM | POA: Insufficient documentation

## 2023-05-08 NOTE — Assessment & Plan Note (Signed)
Last CMP showed hyperkalemia, could be due to potassium rich food Recheck BMP

## 2023-05-08 NOTE — Assessment & Plan Note (Signed)
Skin lesion near right knee likely actinic keratosis, benign

## 2023-05-08 NOTE — Assessment & Plan Note (Signed)
Lipid profile improved with Crestor

## 2023-05-11 ENCOUNTER — Ambulatory Visit (INDEPENDENT_AMBULATORY_CARE_PROVIDER_SITE_OTHER): Payer: Medicare Other

## 2023-05-11 VITALS — Ht 68.0 in | Wt 166.0 lb

## 2023-05-11 DIAGNOSIS — Z0000 Encounter for general adult medical examination without abnormal findings: Secondary | ICD-10-CM

## 2023-05-11 NOTE — Patient Instructions (Addendum)
Ms. [name] , Thank you for taking time to come for your Medicare Wellness Visit. I appreciate your ongoing commitment to your health goals. Please review the following plan we discussed and let me know if I can assist you in the future.   Referrals/Orders/Follow-Ups/Clinician Recommendations:   I will request your mammogram notes from [facility].   This is a list of the screening recommended for you and due dates:  Health Maintenance  Topic Date Due   DTaP/Tdap/Td vaccine (1 - Tdap) Never done   Zoster (Shingles) Vaccine (1 of 2) Never done   Mammogram  10/07/2022   Flu Shot  03/26/2023   COVID-19 Vaccine (10 - 2023-24 season) 04/26/2023   DEXA scan (bone density measurement)  10/08/2023   Medicare Annual Wellness Visit  05/10/2024   Colon Cancer Screening  04/11/2029   Pneumonia Vaccine  Completed   Hepatitis C Screening  Completed   HPV Vaccine  Aged Out    Advanced directives: (ACP Link)Information on Advanced Care Planning can be found at [facility] Advance Health Care Directives Advance Health Care Directives ([email or web address]   Next Medicare Annual Wellness Visit scheduled for next year: July 13, 2024 at 3:40pm virtual visit  Preventive Care 65 Years and Older, Female Preventive care refers to lifestyle choices and visits with your health care provider that can promote health and wellness. Preventive care visits are also called wellness exams. What can I expect for my preventive care visit? Counseling Your health care provider may ask you questions about your: Medical history, including: Past medical problems. Family medical history. Pregnancy and menstrual history. History of falls. Current health, including: Memory and ability to understand (cognition). Emotional well-being. Home life and relationship well-being. Sexual activity and sexual health. Lifestyle, including: Alcohol, nicotine or tobacco, and drug use. Access to  firearms. Diet, exercise, and sleep habits. Work and work Astronomer. Sunscreen use. Safety issues such as seatbelt and bike helmet use. Physical exam Your health care provider will check your: Height and weight. These may be used to calculate your BMI (body mass index). BMI is a measurement that tells if you are at a healthy weight. Waist circumference. This measures the distance around your waistline. This measurement also tells if you are at a healthy weight and may help predict your risk of certain diseases, such as type 2 diabetes and high blood pressure. Heart rate and blood pressure. Body temperature. Skin for abnormal spots. What immunizations do I need?  Vaccines are usually given at various ages, according to a schedule. Your health care provider will recommend vaccines for you based on your age, medical history, and lifestyle or other factors, such as travel or where you work. What tests do I need? Screening Your health care provider may recommend screening tests for certain conditions. This may include: Lipid and cholesterol levels. Hepatitis C test. Hepatitis B test. HIV (human immunodeficiency virus) test. STI (sexually transmitted infection) testing, if you are at risk. Lung cancer screening. Colorectal cancer screening. Diabetes screening. This is done by checking your blood sugar (glucose) after you have not eaten for a while (fasting). Mammogram. Talk with your health care provider about how often you should have regular mammograms. BRCA-related cancer screening. This may be done if you have a family history of breast, ovarian, tubal, or peritoneal cancers. Bone density scan. This is done to screen for osteoporosis. Talk with your health care provider about your test results, treatment options, and if necessary, the need for more  tests. Follow these instructions at home: Eating and drinking  Eat a diet that includes fresh fruits and vegetables, whole grains, lean  protein, and low-fat dairy products. Limit your intake of foods with high amounts of sugar, saturated fats, and salt. Take vitamin and mineral supplements as recommended by your health care provider. Do not drink alcohol if your health care provider tells you not to drink. If you drink alcohol: Limit how much you have to 0-1 drink a day. Know how much alcohol is in your drink. In the [place] one drink equals one 12 oz bottle of beer (355 mL), one 5 oz glass of wine (148 mL), or one 1 oz glass of hard liquor (44 mL). Lifestyle Brush your teeth every morning and night with fluoride toothpaste. Floss one time each day. Exercise for at least 30 minutes 5 or more days each week. Do not use any products that contain nicotine or tobacco. These products include cigarettes, chewing tobacco, and vaping devices, such as e-cigarettes. If you need help quitting, ask your health care provider. Do not use drugs. If you are sexually active, practice safe sex. Use a condom or other form of protection in order to prevent STIs. Take aspirin only as told by your health care provider. Make sure that you understand how much to take and what form to take. Work with your health care provider to find out whether it is safe and beneficial for you to take aspirin daily. Ask your health care provider if you need to take a cholesterol-lowering medicine (statin). Find healthy ways to manage stress, such as: Meditation, yoga, or listening to music. Journaling. Talking to a trusted person. Spending time with friends and family. Minimize exposure to UV radiation to reduce your risk of skin cancer. Safety Always wear your seat belt while driving or riding in a vehicle. Do not drive: If you have been drinking alcohol. Do not ride with someone who has been drinking. When you are tired or distracted. While texting. If you have been using any mind-altering substances or drugs. Wear a helmet and other protective equipment during  sports activities. If you have firearms in your house, make sure you follow all gun safety procedures. What's next? Visit your health care provider once a year for an annual wellness visit. Ask your health care provider how often you should have your eyes and teeth checked. Stay up to date on all vaccines. This information is not intended to replace advice given to you by your health care provider. Make sure you discuss any questions you have with your health care provider. Document Revised: 02/06/2021 Document Reviewed: 02/06/2021 Elsevier Patient Education  2024 [employer]. Understanding Your Risk for Falls Millions of people have serious injuries from falls each year. It is important to understand your risk of falling. Talk with your health care provider about your risk and what you can do to lower it. If you do have a serious fall, make sure to tell your provider. Falling once raises your risk of falling again. How can falls affect me? Serious injuries from falls are common. These include: Broken bones, such as hip fractures. Head injuries, such as traumatic brain injuries (TBI) or concussions. A fear of falling can cause you to avoid activities and stay at home. This can make your muscles weaker and raise your risk for a fall. What can increase my risk? There are a number of risk factors that increase your risk for falling. The more risk factors you have,  the higher your risk of falling. Serious injuries from a fall happen most often to people who are older than 68 years old. Teenagers and young adults ages 36-29 are also at higher risk. Common risk factors include: Weakness in the lower body. Being generally weak or confused due to long-term (chronic) illness. Dizziness or balance problems. Poor vision. Medicines that cause dizziness or drowsiness. These may include: Medicines for your blood pressure, heart, anxiety, insomnia, or swelling (edema). Pain medicines. Muscle  relaxants. Other risk factors include: Drinking alcohol. Having had a fall in the past. Having foot pain or wearing improper footwear. Working at a dangerous job. Having any of the following in your home: Tripping hazards, such as floor clutter or loose rugs. Poor lighting. Pets. Having dementia or memory loss. What actions can I take to lower my risk of falling?     Physical activity Stay physically fit. Do strength and balance exercises. Consider taking a regular class to build strength and balance. Yoga and tai chi are good options. Vision Have your eyes checked every year and your prescription for glasses or contacts updated as needed. Shoes and walking aids Wear non-skid shoes. Wear shoes that have rubber soles and low heels. Do not wear high heels. Do not walk around the house in socks or slippers. Use a cane or walker as told by your provider. Home safety Attach secure railings on both sides of your stairs. Install grab bars for your bathtub, shower, and toilet. Use a non-skid mat in your bathtub or shower. Attach bath mats securely with double-sided, non-slip rug tape. Use good lighting in all rooms. Keep a flashlight near your bed. Make sure there is a clear path from your bed to the bathroom. Use night-lights. Do not use throw rugs. Make sure all carpeting is taped or tacked down securely. Remove all clutter from walkways and stairways, including extension cords. Repair uneven or broken steps and floors. Avoid walking on icy or slippery surfaces. Walk on the grass instead of on icy or slick sidewalks. Use ice melter to get rid of ice on walkways in the winter. Use a cordless phone. Questions to ask your health care provider Can you help me check my risk for a fall? Do any of my medicines make me more likely to fall? Should I take a vitamin D supplement? What exercises can I do to improve my strength and balance? Should I make an appointment to have my vision  checked? Do I need a bone density test to check for weak bones (osteoporosis)? Would it help to use a cane or a walker? Where to find more information Centers for Disease Control and Prevention, STEADI: TonerPromos.no Community-Based Fall Prevention Programs: TonerPromos.no [employer] on Aging: BaseRingTones.pl Contact a health care provider if: You fall at home. You are afraid of falling at home. You feel weak, drowsy, or dizzy. This information is not intended to replace advice given to you by your health care provider. Make sure you discuss any questions you have with your health care provider. Document Revised: 04/14/2022 Document Reviewed: 04/14/2022 Elsevier Patient Education  2024 [employer].

## 2023-05-11 NOTE — Progress Notes (Signed)
Because this visit was a virtual/telehealth visit,  certain criteria was not obtained, such a blood pressure, CBG if applicable, and timed get up and go. Any medications not marked as "taking" were not mentioned during the medication reconciliation part of the visit. Any vitals not documented were not able to be obtained due to this being a telehealth visit or patient was unable to self-report a recent blood pressure reading due to a lack of equipment at home via telehealth. Vitals that have been documented are verbally provided by the patient.   Subjective:   [name] is a 67 y.o. female who presents for Medicare Annual (Subsequent) preventive examination.  Visit Complete: Virtual  I connected with  [name] on 05/11/23 by a audio enabled telemedicine application and verified that I am speaking with the correct person using two identifiers.  Patient Location: Home  Provider Location: Home Office  I discussed the limitations of evaluation and management by telemedicine. The patient expressed understanding and agreed to proceed.  Patient Medicare AWV questionnaire was completed by the patient on na; I have confirmed that all information answered by patient is correct and no changes since this date.  Cardiac Risk Factors include: advanced age (>21men, >48 women);hypertension;dyslipidemia     Objective:    There were no vitals filed for this visit. There is no height or weight on file to calculate BMI.     05/07/2022    3:09 PM 05/01/2021    8:17 AM 05/16/2015    7:12 AM 08/01/2014    7:55 AM  Advanced Directives  Does Patient Have a Medical Advance Directive? No No No No  Would patient like information on creating a medical advance directive? No - Patient declined Yes (MAU/Ambulatory/Procedural Areas - Information given)      Current Medications (verified) Outpatient Encounter Medications as of 05/11/2023  Medication Sig   Fiber POWD Take 1 Dose by mouth daily at 2 am.  Prebiotic fiber supplement   loratadine (CLARITIN) 10 MG tablet Take 10 mg by mouth daily.   Multiple Vitamins-Minerals (CENTRUM SILVER 50+WOMEN) TABS Take 1 tablet by mouth daily at 6 (six) AM.   pantoprazole (PROTONIX) 40 MG tablet TAKE 1 TABLET BY MOUTH EVERY DAY   Propylene Glycol-Glycerin ([employer] ARTIFICIAL TEARS) 1-0.3 % SOLN Apply 1 drop to eye daily. (Patient taking differently: Apply 1 drop to eye daily as needed.)   rizatriptan (MAXALT) 10 MG tablet Take 1 tablet (10 mg total) by mouth as needed for migraine. May repeat in 2 hours if needed   rosuvastatin (CRESTOR) 10 MG tablet TAKE 1 TABLET BY MOUTH EVERY DAY   zolpidem (AMBIEN) 10 MG tablet Take 10 mg by mouth at bedtime as needed.   No facility-administered encounter medications on file as of 05/11/2023.    Allergies (verified) Patient has no allergy information on record.   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   BCC (basal cell carcinoma of skin) 12/20/1993   bcc + margin left nasal bridge tx:exc.   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   scc in situ bowens left cheek   Seasonal allergies    Thyroid nodule    per recent US, has enlarged, but being monitored now   Past Surgical History:  Procedure Laterality Date   ABDOMINAL HYSTERECTOMY N/A    [employer]reesia 08/27/2020   BACK SURGERY  12/24/2006   Lumbosacral spine   COLONOSCOPY     NASAL SINUS SURGERY     neck injection N/A    rotator cuff surgery Right 11/25/2021   SPINE SURGERY N/A    Phreesia 08/27/2020   thyroid lumpectomy  08/26/1979   TONSILLECTOMY     UPPER GASTROINTESTINAL ENDOSCOPY      VAGINAL HYSTERECTOMY     Family History  Problem Relation Age of Onset   Diabetes Mother    COPD Mother    Hypertension Mother    Heart attack Father    Migraines Father    Migraines Sister    Stomach cancer Maternal Grandmother    Other Other        brain tumor   Colon cancer Neg Hx    Esophageal cancer Neg Hx    Rectal cancer Neg Hx    Colon polyps Neg Hx    Social History   Socioeconomic History   Marital status: Married    Spouse name: Not on file   Number of children: 1   Years of education: Not on file   Highest education level: Associate degree: occupational, Scientist, product/process development, or vocational program  Occupational History   Occupation: retired  Tobacco Use   Smoking status: Former    Current packs/day: 0.00    Types: Cigarettes    Quit date: 08/25/1978    Years since quitting: 44.7   Smokeless tobacco: Never  Vaping Use   Vaping status: Never Used  Substance and Sexual Activity   Alcohol use: Not Currently    Alcohol/week: 0.0 - 2.0 standard drinks of alcohol   Drug use: No   Sexual activity: Yes    Birth control/protection: Surgical  Other Topics Concern   Not on file  Social History Narrative   Not on file   Social Determinants of Health   Financial Resource Strain: Low Risk  (05/11/2023)   Overall Financial Resource Strain (CARDIA)    Difficulty of Paying Living Expenses: Not hard at all  Food Insecurity: No Food Insecurity (05/11/2023)   Hunger Vital Sign    Worried About Running Out of Food in the Last Year: Never true    Ran Out of Food in the Last Year: Never true  Transportation Needs: No Transportation Needs (05/11/2023)   PRAPARE - Administrator, Civil Service (Medical): No    Lack of Transportation (Non-Medical): No  Physical Activity: Sufficiently Active (05/11/2023)   Exercise Vital Sign    Days of Exercise per Week: 7 days    Minutes of Exercise per Session: 30 min  Stress: No Stress Concern Present (05/11/2023)   [employer] of  Occupational Health - Occupational Stress Questionnaire    Feeling of Stress : Only a little  Social Connections: Socially Integrated (05/11/2023)   Social Connection and Isolation Panel [NHANES]    Frequency of Communication with Friends and Family: More than three times a week    Frequency of Social Gatherings with Friends and Family: More than three times a week    Attends Religious Services: More than 4 times per year    Active Member of Clubs or Organizations: Yes  Attends Banker Meetings: More than 4 times per year    Marital Status: Married    Tobacco Counseling Counseling given: Not Answered   Clinical Intake:                        Activities of Daily Living    05/11/2023    9:57 AM  In your present state of health, do you have any difficulty performing the following activities:  Hearing? 0  Vision? 0  Difficulty concentrating or making decisions? 0  Walking or climbing stairs? 0  Dressing or bathing? 0  Doing errands, shopping? 0  Preparing Food and eating ? N  Using the Toilet? N  In the past six months, have you accidently leaked urine? N  Do you have problems with loss of bowel control? N  Managing your Medications? N  Managing your Finances? N  Housekeeping or managing your Housekeeping? N    Patient Care Team: Anabel Halon, MD as PCP - General (Internal Medicine)  Indicate any recent Medical Services you may have received from other than [facility] providers in the past year (date may be approximate).     Assessment:   This is a routine wellness examination for Jamiya.  Hearing/Vision screen Hearing Screening - Comments:: Patient denies any hearing difficulties.   Vision Screening - Comments:: Wears rx glasses - up to date with routine eye exams with Dr. London Sheer in [place]    Goals Addressed   None    Depression Screen    05/11/2023    9:53 AM 05/04/2023    8:02 AM 01/05/2023    8:56 AM 10/30/2022    8:09 AM  05/07/2022    3:15 PM 05/07/2022    3:08 PM 10/29/2021    7:59 AM  PHQ 2/9 Scores  PHQ - 2 Score 0 0 0 0 0 0 0  PHQ- 9 Score 0  0        Fall Risk    05/04/2023    8:02 AM 01/05/2023    8:56 AM 10/30/2022    8:09 AM 05/07/2022    3:15 PM 10/29/2021    7:59 AM  Fall Risk   Falls in the past year? 0 0 0 0 0  Number falls in past yr: 0 0 0  0  Injury with Fall? 0 0 0 0 0  Risk for fall due to :    No Fall Risks No Fall Risks  Follow up    Falls evaluation completed Falls evaluation completed    MEDICARE RISK AT HOME: Medicare Risk at Home Any stairs in or around the home?: Yes If so, are there any without handrails?: No Home free of loose throw rugs in walkways, pet beds, electrical cords, etc?: Yes Adequate lighting in your home to reduce risk of falls?: Yes Life alert?: No Use of a cane, walker or w/c?: No Grab bars in the bathroom?: Yes Shower chair or bench in shower?: No Elevated toilet seat or a handicapped toilet?: No  TIMED UP AND GO:  Was the test performed?  No    Cognitive Function:    05/01/2021    8:19 AM  MMSE - Mini Mental State Exam  Not completed: Unable to complete        05/07/2022    3:16 PM 05/01/2021    8:19 AM  6CIT Screen  What Year? 0 points 0 points  What month? 0 points   What time?  0 points 0 points  Count back from 20 0 points 0 points  Months in reverse 0 points 0 points  Repeat phrase 0 points 0 points  Total Score 0 points     Immunizations Immunization History  Administered Date(s) Administered   Fluad Quad(high Dose 65+) 05/19/2022   Influenza, Quadrivalent, Recombinant, Inj, Pf 05/06/2018, 06/18/2021   Influenza,inj,Quad PF,6+ Mos 08/22/2017, 07/07/2019   Influenza-Unspecified 05/07/2016, 07/28/2020, 06/18/2021   Moderna Covid-19 Vaccine Bivalent Booster 65yrs & up 05/19/2022   Moderna SARS-COV2 Booster Vaccination 06/18/2021   Moderna Sars-Covid-2 Vaccination 09/30/2019, 10/29/2019, 06/28/2020, 01/10/2021   PNEUMOCOCCAL  CONJUGATE-20 05/01/2021   Unspecified SARS-COV-2 Vaccination 09/30/2019, 10/29/2019, 06/28/2020, 01/10/2021, 06/18/2021    TDAP status: Due, Education has been provided regarding the importance of this vaccine. Advised may receive this vaccine at local pharmacy or Health Dept. Aware to provide a copy of the vaccination record if obtained from local pharmacy or Health Dept. Verbalized acceptance and understanding.  Flu Vaccine status: Due, Education has been provided regarding the importance of this vaccine. Advised may receive this vaccine at local pharmacy or Health Dept. Aware to provide a copy of the vaccination record if obtained from local pharmacy or Health Dept. Verbalized acceptance and understanding.  Pneumococcal vaccine status: Up to date  Covid-19 vaccine status: Information provided on how to obtain vaccines.   Qualifies for Shingles Vaccine? Yes   Zostavax completed No   Shingrix Completed?: Yes  Screening Tests Health Maintenance  Topic Date Due   DTaP/Tdap/Td (1 - Tdap) Never done   Zoster Vaccines- Shingrix (1 of 2) Never done   MAMMOGRAM  10/07/2022   INFLUENZA VACCINE  03/26/2023   COVID-19 Vaccine (10 - 2023-24 season) 04/26/2023   Medicare Annual Wellness (AWV)  05/08/2023   DEXA SCAN  10/08/2023   Colonoscopy  04/11/2029   Pneumonia Vaccine 38+ Years old  Completed   Hepatitis C Screening  Completed   HPV VACCINES  Aged Out    Health Maintenance  Health Maintenance Due  Topic Date Due   DTaP/Tdap/Td (1 - Tdap) Never done   Zoster Vaccines- Shingrix (1 of 2) Never done   MAMMOGRAM  10/07/2022   INFLUENZA VACCINE  03/26/2023   COVID-19 Vaccine (10 - 2023-24 season) 04/26/2023   Medicare Annual Wellness (AWV)  05/08/2023    Colorectal cancer screening: Type of screening: Colonoscopy. Completed 8/18/20237. Repeat every 7 years  Mammogram Status: Patient stated she had this completed at [facility] in [place]. Will request records.   Bone  Density status: Completed 10/07/2021. Results reflect: Bone density results: OSTEOPOROSIS. Repeat every 2 years.  Lung Cancer Screening: (Low Dose CT Chest recommended if Age 40-80 years, 20 pack-year currently smoking OR have quit w/in 15years.) does not qualify.   Additional Screening:  Hepatitis C Screening: does not qualify; Completed 04/14/2022  Vision Screening: Recommended annual ophthalmology exams for early detection of glaucoma and other disorders of the eye. Is the patient up to date with their annual eye exam?  Yes  Who is the provider or what is the name of the office in which the patient attends annual eye exams? London Sheer, MD  Dental Screening: Recommended annual dental exams for proper oral hygiene  Diabetic Foot Exam: na  Community Resource Referral / Chronic Care Management: CRR required this visit?  No   CCM required this visit?  No     Plan:     I have personally reviewed and noted the following in the patient's chart:  Medical and social history Use of alcohol, tobacco or illicit drugs  Current medications and supplements including opioid prescriptions. Patient is not currently taking opioid prescriptions. Functional ability and status Nutritional status Physical activity Advanced directives List of other physicians Hospitalizations, surgeries, and ER visits in previous 12 months Vitals Screenings to include cognitive, depression, and falls Referrals and appointments  In addition, I have reviewed and discussed with patient certain preventive protocols, quality metrics, and best practice recommendations. A written personalized care plan for preventive services as well as general preventive health recommendations were provided to patient.     Jordan Hawks Ethelle Ola, CMA   05/11/2023   After Visit Summary: (MyChart) Due to this being a telephonic visit, the after visit summary with patients personalized plan was offered to patient via MyChart   Nurse Notes:

## 2023-08-12 ENCOUNTER — Other Ambulatory Visit: Payer: Self-pay | Admitting: Internal Medicine

## 2023-08-12 DIAGNOSIS — K219 Gastro-esophageal reflux disease without esophagitis: Secondary | ICD-10-CM

## 2023-09-21 ENCOUNTER — Telehealth: Payer: Self-pay | Admitting: Neurology

## 2023-09-21 NOTE — Telephone Encounter (Signed)
Pt cancelled appointment due to Pt stated, headaches have improved, do not need the appointment

## 2023-09-23 ENCOUNTER — Telehealth: Payer: Medicare Other | Admitting: Neurology

## 2023-10-27 ENCOUNTER — Other Ambulatory Visit: Payer: Self-pay | Admitting: Internal Medicine

## 2023-10-27 ENCOUNTER — Telehealth: Payer: Self-pay | Admitting: Internal Medicine

## 2023-10-27 DIAGNOSIS — E782 Mixed hyperlipidemia: Secondary | ICD-10-CM

## 2023-10-27 NOTE — Telephone Encounter (Signed)
 LVM to inform she does not need to come in before appt- possible she may need labs drawn after appt

## 2023-10-27 NOTE — Telephone Encounter (Signed)
 Copied from CRM [id number]. Topic: Clinical - Lab/Test Results >> Oct 23, 2023  9:33 AM Geroge Baseman wrote: Reason for CRM: Patient calling in to see if she needs to get labs completed before physical, she would like to if she can.Please call patient to advise if/when labs orders have been placed. >> Oct 26, 2023 11:41 AM Shon Hale wrote: Patient calling in regards to lab request. Patient requesting a call back.

## 2023-11-02 ENCOUNTER — Ambulatory Visit (INDEPENDENT_AMBULATORY_CARE_PROVIDER_SITE_OTHER): Payer: Medicare Other | Admitting: Internal Medicine

## 2023-11-02 ENCOUNTER — Encounter: Payer: Self-pay | Admitting: Internal Medicine

## 2023-11-02 VITALS — BP 127/85 | HR 67 | Ht 67.5 in | Wt 170.0 lb

## 2023-11-02 DIAGNOSIS — R1310 Dysphagia, unspecified: Secondary | ICD-10-CM | POA: Diagnosis not present

## 2023-11-02 DIAGNOSIS — E559 Vitamin D deficiency, unspecified: Secondary | ICD-10-CM

## 2023-11-02 DIAGNOSIS — E041 Nontoxic single thyroid nodule: Secondary | ICD-10-CM

## 2023-11-02 DIAGNOSIS — K219 Gastro-esophageal reflux disease without esophagitis: Secondary | ICD-10-CM | POA: Diagnosis not present

## 2023-11-02 DIAGNOSIS — Z0001 Encounter for general adult medical examination with abnormal findings: Secondary | ICD-10-CM

## 2023-11-02 DIAGNOSIS — G43709 Chronic migraine without aura, not intractable, without status migrainosus: Secondary | ICD-10-CM

## 2023-11-02 DIAGNOSIS — G4700 Insomnia, unspecified: Secondary | ICD-10-CM

## 2023-11-02 DIAGNOSIS — K5792 Diverticulitis of intestine, part unspecified, without perforation or abscess without bleeding: Secondary | ICD-10-CM | POA: Insufficient documentation

## 2023-11-02 DIAGNOSIS — E782 Mixed hyperlipidemia: Secondary | ICD-10-CM

## 2023-11-02 DIAGNOSIS — M810 Age-related osteoporosis without current pathological fracture: Secondary | ICD-10-CM

## 2023-11-02 DIAGNOSIS — R7303 Prediabetes: Secondary | ICD-10-CM | POA: Insufficient documentation

## 2023-11-02 NOTE — Patient Instructions (Signed)
Please continue to take medications as prescribed.  Please continue to follow low carb diet and perform moderate exercise/walking at least 150 mins/week.  Please get fasting blood tests done within a week.  

## 2023-11-02 NOTE — Assessment & Plan Note (Signed)
 Her symptoms of LLQ abdominal pain and fever are concerning for recurrent diverticulitis, although no imaging findings to confirm diverticulitis Advised to contact during acute symptoms Local referral to GI as patient request

## 2023-11-02 NOTE — Assessment & Plan Note (Signed)
Takes Ambien 5 mg PRN, gets it through Ob.Gyn.

## 2023-11-02 NOTE — Assessment & Plan Note (Signed)
 Was on Fosamax Advised to take Vitamin D 2000 IU for now Gets DEXA scan with OB/GYN

## 2023-11-02 NOTE — Assessment & Plan Note (Signed)
 Lab Results  Component Value Date   HGBA1C 5.8 (H) 10/30/2022   Advised to follow low carb diet

## 2023-11-02 NOTE — Assessment & Plan Note (Signed)
 Stable sized thyroid nodule Check TSH Used to be followed by Dr. Talmage Nap

## 2023-11-02 NOTE — Assessment & Plan Note (Signed)
Well-controlled with pantoprazole ?

## 2023-11-02 NOTE — Assessment & Plan Note (Signed)
On Maxalt (PRN) Follows up with Neurology

## 2023-11-02 NOTE — Progress Notes (Signed)
 Established Patient Office Visit  Subjective:  Patient ID: [name], female    DOB: 1956/08/03  Age: 68 y.o. MRN: [id number]  CC:  Chief Complaint  Patient presents with   Annual Exam    Cpe , would like a new referral to gastro, having diverticulitis flare ups.    HPI [name] is a 68 y.o. female with past medical history of osteoporosis, GERD, migraine and insomnia who presents for annual physical.  She reports 2 episodes of severe LLQ abdominal pain and fever in the last 6 months, lasting for few days.  She has a history of sigmoid diverticulosis and attributes her symptoms to diverticulitis.  She could not get medical evaluation during acute flareups.  Last colonoscopy in 2023 by Dr. Leone Payor. She requests local GI referral.  She has difficulty swallowing especially with solid food sometimes. She states that she has worse dysphagia with certain food, such as rice. She had GI follow up for dysphagia.  She had EGD in 08/23, and dilation of esophageal stricture.  She had relief for only 3 months, and started having dysphagia again. She tries to chew her food well to avoid chocking sensation. Her acid reflux is better with Pantoprazole.  She has been taking Crestor for HLD. BP is well-controlled. Patient denies headache, dizziness, chest pain, dyspnea or palpitations.   She takes Maxalt as needed for migraine, needs it about 2-3 times in a month.  She has visual disturbance and nausea when she has migraine flareup.  Her neurologist had tried to give her Bernita Raisin or Nurtec, but were not cost effective.    Past Medical History:  Diagnosis Date   Anxiety    Arthritis    BCC (basal cell carcinoma of skin) 11/22/1993   bcc left nasal bridge tx: curet, exc.   BCC (basal cell carcinoma of skin) 12/20/1993   bcc + margin left nasal bridge tx:exc.   Common migraine 08/01/2014   Depression    DIVERTICULITIS-COLON 07/04/2009   Qualifier: Diagnosis of   By: Monica Becton PA-c, Amy  S      Diverticulosis    GERD (gastroesophageal reflux disease)    on meds   History of hepatitis A    Osteoporosis    SCC (squamous cell carcinoma) 05/11/2002   scc in situ bowens left cheek   Seasonal allergies    Thyroid nodule    per recent US, has enlarged, but being monitored now    Past Surgical History:  Procedure Laterality Date   ABDOMINAL HYSTERECTOMY N/A    [employer] 08/27/2020   BACK SURGERY  12/24/2006   Lumbosacral spine   COLONOSCOPY     NASAL SINUS SURGERY     neck injection N/A    rotator cuff surgery Right 11/25/2021   SPINE SURGERY N/A    [employer] 08/27/2020   thyroid lumpectomy  08/26/1979   TONSILLECTOMY     UPPER GASTROINTESTINAL ENDOSCOPY     VAGINAL HYSTERECTOMY      Family History  Problem Relation Age of Onset   Diabetes Mother    COPD Mother    Hypertension Mother    Heart attack Father    Migraines Father    Migraines Sister    Stomach cancer Maternal Grandmother    Other Other        brain tumor   Colon cancer Neg Hx    Esophageal cancer Neg Hx    Rectal cancer Neg Hx    Colon polyps Neg Hx  Social History   Socioeconomic History   Marital status: Married    Spouse name: Not on file   Number of children: 1   Years of education: Not on file   Highest education level: Associate degree: occupational, Scientist, product/process development, or vocational program  Occupational History   Occupation: retired  Tobacco Use   Smoking status: Former    Current packs/day: 0.00    Types: Cigarettes    Quit date: 08/25/1978    Years since quitting: 45.2   Smokeless tobacco: Never  Vaping Use   Vaping status: Never Used  Substance and Sexual Activity   Alcohol use: Not Currently    Alcohol/week: 0.0 - 2.0 standard drinks of alcohol   Drug use: No   Sexual activity: Yes    Birth control/protection: Surgical  Other Topics Concern   Not on file  Social History Narrative   Not on file   Social Drivers of Health   Financial Resource Strain: Low Risk   (10/30/2023)   Overall Financial Resource Strain (CARDIA)    Difficulty of Paying Living Expenses: Not very hard  Food Insecurity: No Food Insecurity (10/30/2023)   Hunger Vital Sign    Worried About Running Out of Food in the Last Year: Never true    Ran Out of Food in the Last Year: Never true  Transportation Needs: No Transportation Needs (10/30/2023)   PRAPARE - Administrator, Civil Service (Medical): No    Lack of Transportation (Non-Medical): No  Physical Activity: Insufficiently Active (10/30/2023)   Exercise Vital Sign    Days of Exercise per Week: 4 days    Minutes of Exercise per Session: 30 min  Stress: Stress Concern Present (10/30/2023)   [employer] of Occupational Health - Occupational Stress Questionnaire    Feeling of Stress : To some extent  Social Connections: Moderately Integrated (10/30/2023)   Social Connection and Isolation Panel [NHANES]    Frequency of Communication with Friends and Family: Once a week    Frequency of Social Gatherings with Friends and Family: Once a week    Attends Religious Services: More than 4 times per year    Active Member of [employer] or Organizations: Yes    Attends Banker Meetings: 1 to 4 times per year    Marital Status: Married  Catering manager Violence: Not At Risk (05/11/2023)   Humiliation, Afraid, Rape, and Kick questionnaire    Fear of Current or Ex-Partner: No    Emotionally Abused: No    Physically Abused: No    Sexually Abused: No    Outpatient Medications Prior to Visit  Medication Sig Dispense Refill   estradiol (VIVELLE-DOT) 0.05 MG/24HR patch Place 1 patch onto the skin 2 (two) times a week.     Fiber POWD Take 1 Dose by mouth daily at 2 am. Prebiotic fiber supplement     loratadine (CLARITIN) 10 MG tablet Take 10 mg by mouth daily.     Multiple Vitamins-Minerals (CENTRUM SILVER 50+WOMEN) TABS Take 1 tablet by mouth daily at 6 (six) AM.     pantoprazole (PROTONIX) 40 MG tablet TAKE 1 TABLET BY  MOUTH EVERY DAY 90 tablet 3   Propylene Glycol-Glycerin ([employer] ARTIFICIAL TEARS) 1-0.3 % SOLN Apply 1 drop to eye daily. (Patient taking differently: Apply 1 drop to eye daily as needed.) 15 mL 0   rizatriptan (MAXALT) 10 MG tablet Take 1 tablet (10 mg total) by mouth as needed for migraine. May repeat in 2 hours if needed  10 tablet 3   rosuvastatin (CRESTOR) 10 MG tablet TAKE 1 TABLET BY MOUTH EVERY DAY 90 tablet 3   zolpidem (AMBIEN) 10 MG tablet Take 10 mg by mouth at bedtime as needed.     No facility-administered medications prior to visit.    No Known Allergies  ROS Review of Systems  Constitutional:  Negative for chills and fever.  HENT:  Negative for congestion, sinus pressure, sinus pain and sore throat.   Eyes:  Negative for pain and discharge.       Dry eyes  Respiratory:  Negative for cough and shortness of breath.   Cardiovascular:  Negative for chest pain and palpitations.  Gastrointestinal:  Negative for abdominal pain, constipation, diarrhea, nausea and vomiting.       Dysphagia  Endocrine: Negative for polydipsia and polyuria.  Genitourinary:  Negative for dysuria and hematuria.  Musculoskeletal:  Negative for neck pain and neck stiffness.  Skin:  Negative for rash.  Neurological:  Negative for dizziness and weakness.  Psychiatric/Behavioral:  Negative for agitation and behavioral problems.       Objective:    Physical Exam Vitals reviewed.  Constitutional:      General: She is not in acute distress.    Appearance: She is not diaphoretic.  HENT:     Head: Normocephalic and atraumatic.     Nose: Nose normal.     Mouth/Throat:     Mouth: Mucous membranes are moist.  Eyes:     General: No scleral icterus.    Extraocular Movements: Extraocular movements intact.  Cardiovascular:     Rate and Rhythm: Normal rate and regular rhythm.     Pulses: Normal pulses.     Heart sounds: Normal heart sounds. No murmur heard. Pulmonary:     Breath sounds: Normal breath  sounds. No wheezing or rales.  Abdominal:     Palpations: Abdomen is soft.     Tenderness: There is no abdominal tenderness.  Musculoskeletal:     Cervical back: Neck supple. No tenderness.     Right lower leg: No edema.     Left lower leg: No edema.  Skin:    General: Skin is warm.     Findings: No rash.     Comments: 2 soft tissue mass over right thigh, lipoma-like, around 0.5 cm in diameter each. No warmth or erythema 1 verrucous growth over right leg, near knee on lateral side, whitish, about 1 cm in length  Neurological:     General: No focal deficit present.     Mental Status: She is alert and oriented to person, place, and time.     Cranial Nerves: No cranial nerve deficit.     Sensory: No sensory deficit.     Motor: No weakness.  Psychiatric:        Mood and Affect: Mood normal.        Behavior: Behavior normal.     BP 127/85   Pulse 67   Ht 5' 7.5" (1.715 m)   Wt 170 lb (77.1 kg)   SpO2 98%   BMI 26.23 kg/m  Wt Readings from Last 3 Encounters:  11/02/23 170 lb (77.1 kg)  05/11/23 166 lb (75.3 kg)  05/04/23 165 lb 6.4 oz (75 kg)    Lab Results  Component Value Date   TSH 1.910 10/30/2022   Lab Results  Component Value Date   WBC 5.3 10/30/2022   HGB 12.4 10/30/2022   HCT 38.1 10/30/2022   MCV 91 10/30/2022  PLT 295 10/30/2022   Lab Results  Component Value Date   NA 143 05/04/2023   K 4.3 05/04/2023   CO2 23 05/04/2023   GLUCOSE 90 05/04/2023   BUN 16 05/04/2023   CREATININE 0.96 05/04/2023   BILITOT 0.3 10/30/2022   ALKPHOS 70 10/30/2022   AST 20 10/30/2022   ALT 14 10/30/2022   PROT 6.6 10/30/2022   ALBUMIN 4.6 10/30/2022   CALCIUM 9.5 05/04/2023   EGFR 65 05/04/2023   Lab Results  Component Value Date   CHOL 180 10/30/2022   Lab Results  Component Value Date   HDL 64 10/30/2022   Lab Results  Component Value Date   LDLCALC 103 (H) 10/30/2022   Lab Results  Component Value Date   TRIG 68 10/30/2022   Lab Results  Component  Value Date   CHOLHDL 2.8 10/30/2022   Lab Results  Component Value Date   HGBA1C 5.8 (H) 10/30/2022      Assessment & Plan:   Problem List Items Addressed This Visit       Cardiovascular and Mediastinum   Migraine   On Maxalt (PRN) Follows up with Neurology      Relevant Orders   CMP14+EGFR   CBC with Differential/Platelet   TSH + free T4     Digestive   Gastroesophageal reflux disease   Well controlled with pantoprazole      Dysphagia   H/o esophageal stricture in the past, s/p balloon dilation Has had GI follow up, but requests local GI referral Has small bites, chews food well      Relevant Orders   Ambulatory referral to Gastroenterology     Endocrine   Nontoxic single thyroid nodule   Stable sized thyroid nodule Check TSH Used to be followed by Dr. Talmage Nap      Relevant Orders   TSH + free T4     Musculoskeletal and Integument   Osteoporosis   Was on Fosamax Advised to take Vitamin D 2000 IU for now Gets DEXA scan with OB/GYN        Other   Mixed hyperlipidemia (Chronic)   Lipid profile improved with Crestor Continue to follow low carb diet      Relevant Orders   Lipid panel   Insomnia   Takes Ambien 5 mg PRN, gets it through Ob.Gyn.      Encounter for general adult medical examination with abnormal findings - Primary   Physical exam as documented. Fasting blood tests today. Mammography and DEXA scan with Ob./Gyn.      Diverticulitis   Her symptoms of LLQ abdominal pain and fever are concerning for recurrent diverticulitis, although no imaging findings to confirm diverticulitis Advised to contact during acute symptoms Local referral to GI as patient request      Relevant Orders   Ambulatory referral to Gastroenterology   CMP14+EGFR   CBC with Differential/Platelet   Prediabetes   Lab Results  Component Value Date   HGBA1C 5.8 (H) 10/30/2022   Advised to follow low carb diet      Relevant Orders   Hemoglobin A1c   Other  Visit Diagnoses       Vitamin D deficiency       Relevant Orders   VITAMIN D 25 Hydroxy (Vit-D Deficiency, Fractures)       No orders of the defined types were placed in this encounter.   Follow-up: Return in about 1 year (around 11/01/2024) for Annual physical.    Anabel Halon, MD

## 2023-11-02 NOTE — Assessment & Plan Note (Addendum)
 H/o esophageal stricture in the past, s/p balloon dilation Has had GI follow up, but requests local GI referral Has small bites, chews food well

## 2023-11-02 NOTE — Assessment & Plan Note (Addendum)
 Physical exam as documented. Fasting blood tests today. Mammography and DEXA scan with Ob./Gyn.

## 2023-11-02 NOTE — Assessment & Plan Note (Addendum)
 Lipid profile improved with Crestor Continue to follow low carb diet

## 2023-11-03 ENCOUNTER — Encounter (INDEPENDENT_AMBULATORY_CARE_PROVIDER_SITE_OTHER): Payer: Self-pay | Admitting: *Deleted

## 2023-11-03 LAB — CBC WITH DIFFERENTIAL/PLATELET
Basophils Absolute: 0 10*3/uL (ref 0.0–0.2)
Basos: 1 %
EOS (ABSOLUTE): 0.1 10*3/uL (ref 0.0–0.4)
Eos: 2 %
Hematocrit: 42.4 % (ref 34.0–46.6)
Hemoglobin: 13.8 g/dL (ref 11.1–15.9)
Immature Grans (Abs): 0 10*3/uL (ref 0.0–0.1)
Immature Granulocytes: 0 %
Lymphocytes Absolute: 3.1 10*3/uL (ref 0.7–3.1)
Lymphs: 46 %
MCH: 29.5 pg (ref 26.6–33.0)
MCHC: 32.5 g/dL (ref 31.5–35.7)
MCV: 91 fL (ref 79–97)
Monocytes Absolute: 0.5 10*3/uL (ref 0.1–0.9)
Monocytes: 8 %
Neutrophils Absolute: 2.9 10*3/uL (ref 1.4–7.0)
Neutrophils: 43 %
Platelets: 328 10*3/uL (ref 150–450)
RBC: 4.68 x10E6/uL (ref 3.77–5.28)
RDW: 12.5 % (ref 11.7–15.4)
WBC: 6.6 10*3/uL (ref 3.4–10.8)

## 2023-11-03 LAB — LIPID PANEL
Chol/HDL Ratio: 3.3 ratio (ref 0.0–4.4)
Cholesterol, Total: 196 mg/dL (ref 100–199)
HDL: 60 mg/dL (ref >39)
LDL Chol Calc (NIH): 121 mg/dL — ABNORMAL HIGH (ref 0–99)
Triglycerides: 84 mg/dL (ref 0–149)
VLDL Cholesterol Cal: 15 mg/dL (ref 5–40)

## 2023-11-03 LAB — CMP14+EGFR
ALT: 11 IU/L (ref 0–32)
AST: 19 IU/L (ref 0–40)
Albumin: 4.6 g/dL (ref 3.9–4.9)
Alkaline Phosphatase: 65 IU/L (ref 44–121)
BUN/Creatinine Ratio: 21 (ref 12–28)
BUN: 20 mg/dL (ref 8–27)
Bilirubin Total: 0.3 mg/dL (ref 0.0–1.2)
CO2: 23 mmol/L (ref 20–29)
Calcium: 9.3 mg/dL (ref 8.7–10.3)
Chloride: 103 mmol/L (ref 96–106)
Creatinine, Ser: 0.97 mg/dL (ref 0.57–1.00)
Globulin, Total: 2.2 g/dL (ref 1.5–4.5)
Glucose: 94 mg/dL (ref 70–99)
Potassium: 5.2 mmol/L (ref 3.5–5.2)
Sodium: 140 mmol/L (ref 134–144)
Total Protein: 6.8 g/dL (ref 6.0–8.5)
eGFR: 64 mL/min/{1.73_m2} (ref >59)

## 2023-11-03 LAB — VITAMIN D 25 HYDROXY (VIT D DEFICIENCY, FRACTURES): Vit D, 25-Hydroxy: 47.4 ng/mL (ref 30.0–100.0)

## 2023-11-03 LAB — TSH+FREE T4
Free T4: 1.06 ng/dL (ref 0.82–1.77)
TSH: 2.35 u[IU]/mL (ref 0.450–4.500)

## 2023-11-03 LAB — HEMOGLOBIN A1C
Est. average glucose Bld gHb Est-mCnc: 114 mg/dL
Hgb A1c MFr Bld: 5.6 % (ref 4.8–5.6)

## 2023-11-26 ENCOUNTER — Ambulatory Visit (INDEPENDENT_AMBULATORY_CARE_PROVIDER_SITE_OTHER): Admitting: Gastroenterology

## 2023-11-26 ENCOUNTER — Encounter (INDEPENDENT_AMBULATORY_CARE_PROVIDER_SITE_OTHER): Payer: Self-pay | Admitting: Gastroenterology

## 2023-11-26 VITALS — BP 123/85 | HR 84 | Temp 97.5°F | Ht 68.0 in | Wt 170.0 lb

## 2023-11-26 DIAGNOSIS — R1310 Dysphagia, unspecified: Secondary | ICD-10-CM | POA: Diagnosis not present

## 2023-11-26 DIAGNOSIS — K219 Gastro-esophageal reflux disease without esophagitis: Secondary | ICD-10-CM

## 2023-11-26 DIAGNOSIS — R140 Abdominal distension (gaseous): Secondary | ICD-10-CM | POA: Insufficient documentation

## 2023-11-26 DIAGNOSIS — R109 Unspecified abdominal pain: Secondary | ICD-10-CM | POA: Diagnosis not present

## 2023-11-26 DIAGNOSIS — K5730 Diverticulosis of large intestine without perforation or abscess without bleeding: Secondary | ICD-10-CM | POA: Diagnosis not present

## 2023-11-26 MED ORDER — OMEPRAZOLE 40 MG PO CPDR: 40.0000 mg | DELAYED_RELEASE_CAPSULE | Freq: Every day | ORAL | 3 refills | Status: DC

## 2023-11-26 NOTE — Patient Instructions (Addendum)
 Schedule EGD Perform blood workup Explained presumed etiology of IBS symptoms. Patient was counseled about the benefit of implementing a low FODMAP to improve symptoms and recurrent episodes. A dietary list was provided to the patient. Also, the patient was counseled about the benefit of avoiding stressing situations and potential environmental triggers leading to symptomatology. Start omeprazole 40 mg qday, stop pantoprazole

## 2023-11-26 NOTE — Progress Notes (Signed)
 Katrinka Blazing, M.D. Gastroenterology & Hepatology [facility] [address] [place], [place] [address] Primary Care Physician: Anabel Halon, MD [address] [place] [address]  Referring MD: PCP  Chief Complaint: Dysphagia, abdominal pain episodes and recurrent heartburn  History of Present Illness: [name] is a 68 y.o. female with past medical history of anxiety, basal cell carcinoma, depression, migraine, diverticulosis, osteoporosis, squamous cell carcinoma, who presents for evaluation of dysphagia, abdominal pain episodes, bloating and recurrent heartburn.  Patient reports that she has presented intermittent episodes of abdominal pain in her LLQ every 2-3 months. She also reports having episodes of fever up to 101 when this happens. She also feel very exhausted and has to sleep for several day. She reports having these episodes for the last 5-6 years, with last episode in January. She has been told "this could be diverticulitis" but has been never seen in the ER or had imaging for this in the past. She has not taken antibiotics as her symptoms improve on its own.  States that for the last 40 years, she reports that she has episodes of dysphagia. She usually has issues with dysphagia with solids, either chicken, steak or rice - there was one time she had to vomit the food as it was not going down. No dysphagia with liquids, but she usually drink after eating as she feels that if she drinks liquids in between "there are spasms in my esophagus". No food impaction episodes. She is taking pantoprazole 40 mg qday before breakfast and a prebiotic supplement powder.  She reports having frequent heartburn and burping. She burps daily. She stopped coffee 5 days ago as it was making her burping really bad. She feels nauseated frequently. She has heartburn once a week, depending on what she eats. May take an extra dose of pantoprazole  when  she has breakthrough GERD episodes.  She also has had chronic issues with bloating. She had this symtpoms daily and has been a chronic issue for multiple years. She has issues with constipation - she takes a Vear Clock laxative (magnesium) constipation tablet 1/2 tablet every night which makes her have a BM daily.  The patient denies having any vomiting, fever, chills, hematochezia, melena, hematemesis, diarrhea, jaundice, pruritus or weight loss.  The patient was previously being seen by Dr. Leone Payor for dysphagia.  Last evaluation was performed in 2023. Has had two dilations at GE junction in the past.  Last labs performed on 11/02/2023 which showed a CMP with potassium 5.2, creatinine 0.97, normal liver panel, normal CBC, TSH 2.35.  Last EGD:04/18/22 - Dr. Leone Payor - Benign-appearing esophageal stenosis. Dilated.                           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                           - 2 cm hiatal hernia.                           -  Multiple gastric polyps. Biopsied, showing fundic gland polyps                           - Gastritis. Biopsied, showing reactive gastropathy.                           - Normal examined duodenum.  Last Colonoscopy:04/11/2022 - Dr. Leone Payor - The perianal and digital rectal examinations were normal. - Two sessile polyps were found in the ascending colon and cecum. The polyps were 1 to 2 mm in size. These polyps were removed with a cold biopsy forceps. Resection and retrieval were complete. Verification of patient identification for the specimen was done. Estimated blood loss was minimal. - Multiple diverticula were found in the sigmoid colon. - The exam was otherwise without abnormality on direct and retroflexion views.  Pathology consistent with 2 tubular adenomas, recommended repeat colonoscopy in 2030.  FHx: Son and  mother had dysphagia issues as well, neg for any gastrointestinal/liver disease, grandmother gastric cancer Social: neg smoking, alcohol or illicit drug use Surgical: no abdominal surgeries  Past Medical History: Past Medical History:  Diagnosis Date   Anxiety    Arthritis    BCC (basal cell carcinoma of skin) 11/22/1993   bcc left nasal bridge tx: curet, exc.   BCC (basal cell carcinoma of skin) 12/20/1993   bcc + margin left nasal bridge tx:exc.   Common migraine 08/01/2014   Depression    DIVERTICULITIS-COLON 07/04/2009   Qualifier: Diagnosis of   By: Monica Becton PA-c, Amy S      Diverticulosis    GERD (gastroesophageal reflux disease)    on meds   History of hepatitis A    Osteoporosis    SCC (squamous cell carcinoma) 05/11/2002   scc in situ bowens left cheek   Seasonal allergies    Thyroid nodule    per recent US, has enlarged, but being monitored now    Past Surgical History: Past Surgical History:  Procedure Laterality Date   ABDOMINAL HYSTERECTOMY N/A    [facility] 08/27/2020   BACK SURGERY  12/24/2006   Lumbosacral spine   COLONOSCOPY     NASAL SINUS SURGERY     neck injection N/A    rotator cuff surgery Right 11/25/2021   SPINE SURGERY N/A    [facility] 08/27/2020   thyroid lumpectomy  08/26/1979   TONSILLECTOMY     UPPER GASTROINTESTINAL ENDOSCOPY     VAGINAL HYSTERECTOMY      Family History: Family History  Problem Relation Age of Onset   Diabetes Mother    COPD Mother    Hypertension Mother    Heart attack Father    Migraines Father    Migraines Sister    Stomach cancer Maternal Grandmother    Other Other        brain tumor   Colon cancer Neg Hx    Esophageal cancer Neg Hx    Rectal cancer Neg Hx    Colon polyps Neg Hx     Social History: Social History   Tobacco Use  Smoking Status Former   Current packs/day: 0.00   Types: Cigarettes   Quit date: 08/25/1978   Years since quitting: 45.2  Smokeless Tobacco Never   Social History    Substance and Sexual Activity  Alcohol Use Not Currently   Alcohol/week: 0.0 - 2.0 standard drinks of alcohol   Social History   Substance and Sexual  Activity  Drug Use No    Allergies: No Known Allergies  Medications: Current Outpatient Medications  Medication Sig Dispense Refill   estradiol (VIVELLE-DOT) 0.05 MG/24HR patch Place 1 patch onto the skin 2 (two) times a week.     Fiber POWD Take 1 Dose by mouth daily at 2 am. Prebiotic fiber supplement     loratadine (CLARITIN) 10 MG tablet Take 10 mg by mouth daily.     Multiple Vitamins-Minerals (CENTRUM SILVER 50+WOMEN) TABS Take 1 tablet by mouth daily at 6 (six) AM.     pantoprazole (PROTONIX) 40 MG tablet TAKE 1 TABLET BY MOUTH EVERY DAY 90 tablet 3   Propylene Glycol-Glycerin ([employer] ARTIFICIAL TEARS) 1-0.3 % SOLN Apply 1 drop to eye daily. (Patient taking differently: Apply 1 drop to eye daily as needed.) 15 mL 0   rizatriptan (MAXALT) 10 MG tablet Take 1 tablet (10 mg total) by mouth as needed for migraine. May repeat in 2 hours if needed 10 tablet 3   rosuvastatin (CRESTOR) 10 MG tablet TAKE 1 TABLET BY MOUTH EVERY DAY 90 tablet 3   zolpidem (AMBIEN) 10 MG tablet Take 10 mg by mouth at bedtime as needed.     No current facility-administered medications for this visit.    Review of Systems: GENERAL: negative for malaise, night sweats HEENT: No changes in hearing or vision, no nose bleeds or other nasal problems. NECK: Negative for lumps, goiter, pain and significant neck swelling RESPIRATORY: Negative for cough, wheezing CARDIOVASCULAR: Negative for chest pain, leg swelling, palpitations, orthopnea GI: SEE HPI MUSCULOSKELETAL: Negative for joint pain or swelling, back pain, and muscle pain. SKIN: Negative for lesions, rash PSYCH: Negative for sleep disturbance, mood disorder and recent psychosocial stressors. HEMATOLOGY Negative for prolonged bleeding, bruising easily, and swollen nodes. ENDOCRINE: Negative for cold or  heat intolerance, polyuria, polydipsia and goiter. NEURO: negative for tremor, gait imbalance, syncope and seizures. The remainder of the review of systems is noncontributory.   Physical Exam: BP 123/85 (BP Location: Left Arm, Patient Position: Sitting, Cuff Size: Normal)   Pulse 84   Temp (!) 97.5 F (36.4 C) (Temporal)   Ht 5\' 8"  (1.727 m)   Wt 170 lb (77.1 kg)   BMI 25.85 kg/m  GENERAL: The patient is AO x3, in no acute distress. HEENT: Head is normocephalic and atraumatic. EOMI are intact. Mouth is well hydrated and without lesions. NECK: Supple. No masses LUNGS: Clear to auscultation. No presence of rhonchi/wheezing/rales. Adequate chest expansion HEART: RRR, normal s1 and s2. ABDOMEN:tender to palpation in the lower abdomen, no guarding, no peritoneal signs, and nondistended. BS +. No masses. EXTREMITIES: Without any cyanosis, clubbing, rash, lesions or edema. NEUROLOGIC: AOx3, no focal motor deficit. SKIN: no jaundice, no rashes   Imaging/Labs: as above  I personally reviewed and interpreted the available labs, imaging and endoscopic files.  Impression and Plan: [name] is a 68 y.o. female with past medical history of anxiety, basal cell carcinoma, depression, migraine, diverticulosis, osteoporosis, squamous cell carcinoma, who presents for evaluation of dysphagia, abdominal pain episodes, bloating and recurrent heartburn.  Patient has presented chronic issues of bloating for multiple years, along with intermittent episodes of severe abdominal pain recently that have been self-limited.  Abdominal pain episodes have been attributed to possible diverticulitis, although this has never been confirmed with cross-sectional abdominal imaging.  It is possible that part of her symptoms are related to IBS given the chronicity of the symptoms and lack of red flag signs, for which  she will benefit from implementing a low FODMAP diet.  We will check a celiac disease panel at this  point.  Regarding her episodes of heartburn and episodes dysphagia, we will need to evaluate this further with an EGD with possible repeat dilation and esophageal sampling.  If symptoms persist may consider proceeding with an esophageal manometry.  We will try a different PPI with stronger potency such as omeprazole at this point.  -Schedule EGD -Check celiac disease panel -Explained presumed etiology of IBS symptoms. Patient was counseled about the benefit of implementing a low FODMAP to improve symptoms and recurrent episodes. A dietary list was provided to the patient. Also, the patient was counseled about the benefit of avoiding stressing situations and potential environmental triggers leading to symptomatology. -Start omeprazole 40 mg qday, stop pantoprazole  All questions were answered.      Katrinka Blazing, MD Gastroenterology and Hepatology [facility]

## 2023-11-26 NOTE — H&P (View-Only) (Signed)
 Katrinka Blazing, M.D. Gastroenterology & Hepatology [facility] [address] [place], [place] [address] Primary Care Physician: Anabel Halon, MD [address] [place] [address]  Referring MD: PCP  Chief Complaint: Dysphagia, abdominal pain episodes and recurrent heartburn  History of Present Illness: [name] is a 68 y.o. female with past medical history of anxiety, basal cell carcinoma, depression, migraine, diverticulosis, osteoporosis, squamous cell carcinoma, who presents for evaluation of dysphagia, abdominal pain episodes, bloating and recurrent heartburn.  Patient reports that she has presented intermittent episodes of abdominal pain in her LLQ every 2-3 months. She also reports having episodes of fever up to 101 when this happens. She also feel very exhausted and has to sleep for several day. She reports having these episodes for the last 5-6 years, with last episode in January. She has been told "this could be diverticulitis" but has been never seen in the ER or had imaging for this in the past. She has not taken antibiotics as her symptoms improve on its own.  States that for the last 40 years, she reports that she has episodes of dysphagia. She usually has issues with dysphagia with solids, either chicken, steak or rice - there was one time she had to vomit the food as it was not going down. No dysphagia with liquids, but she usually drink after eating as she feels that if she drinks liquids in between "there are spasms in my esophagus". No food impaction episodes. She is taking pantoprazole 40 mg qday before breakfast and a prebiotic supplement powder.  She reports having frequent heartburn and burping. She burps daily. She stopped coffee 5 days ago as it was making her burping really bad. She feels nauseated frequently. She has heartburn once a week, depending on what she eats. May take an extra dose of pantoprazole  when  she has breakthrough GERD episodes.  She also has had chronic issues with bloating. She had this symtpoms daily and has been a chronic issue for multiple years. She has issues with constipation - she takes a Vear Clock laxative (magnesium) constipation tablet 1/2 tablet every night which makes her have a BM daily.  The patient denies having any vomiting, fever, chills, hematochezia, melena, hematemesis, diarrhea, jaundice, pruritus or weight loss.  The patient was previously being seen by Dr. Leone Payor for dysphagia.  Last evaluation was performed in 2023. Has had two dilations at GE junction in the past.  Last labs performed on 11/02/2023 which showed a CMP with potassium 5.2, creatinine 0.97, normal liver panel, normal CBC, TSH 2.35.  Last EGD:04/18/22 - Dr. Leone Payor - Benign-appearing esophageal stenosis. Dilated.                           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                           - 2 cm hiatal hernia.                           -  Multiple gastric polyps. Biopsied, showing fundic gland polyps                           - Gastritis. Biopsied, showing reactive gastropathy.                           - Normal examined duodenum.  Last Colonoscopy:04/11/2022 - Dr. Leone Payor - The perianal and digital rectal examinations were normal. - Two sessile polyps were found in the ascending colon and cecum. The polyps were 1 to 2 mm in size. These polyps were removed with a cold biopsy forceps. Resection and retrieval were complete. Verification of patient identification for the specimen was done. Estimated blood loss was minimal. - Multiple diverticula were found in the sigmoid colon. - The exam was otherwise without abnormality on direct and retroflexion views.  Pathology consistent with 2 tubular adenomas, recommended repeat colonoscopy in 2030.  FHx: Son and  mother had dysphagia issues as well, neg for any gastrointestinal/liver disease, grandmother gastric cancer Social: neg smoking, alcohol or illicit drug use Surgical: no abdominal surgeries  Past Medical History: Past Medical History:  Diagnosis Date   Anxiety    Arthritis    BCC (basal cell carcinoma of skin) 11/22/1993   bcc left nasal bridge tx: curet, exc.   BCC (basal cell carcinoma of skin) 12/20/1993   bcc + margin left nasal bridge tx:exc.   Common migraine 08/01/2014   Depression    DIVERTICULITIS-COLON 07/04/2009   Qualifier: Diagnosis of   By: Monica Becton PA-c, Amy S      Diverticulosis    GERD (gastroesophageal reflux disease)    on meds   History of hepatitis A    Osteoporosis    SCC (squamous cell carcinoma) 05/11/2002   scc in situ bowens left cheek   Seasonal allergies    Thyroid nodule    per recent US, has enlarged, but being monitored now    Past Surgical History: Past Surgical History:  Procedure Laterality Date   ABDOMINAL HYSTERECTOMY N/A    [facility] 08/27/2020   BACK SURGERY  12/24/2006   Lumbosacral spine   COLONOSCOPY     NASAL SINUS SURGERY     neck injection N/A    rotator cuff surgery Right 11/25/2021   SPINE SURGERY N/A    [facility] 08/27/2020   thyroid lumpectomy  08/26/1979   TONSILLECTOMY     UPPER GASTROINTESTINAL ENDOSCOPY     VAGINAL HYSTERECTOMY      Family History: Family History  Problem Relation Age of Onset   Diabetes Mother    COPD Mother    Hypertension Mother    Heart attack Father    Migraines Father    Migraines Sister    Stomach cancer Maternal Grandmother    Other Other        brain tumor   Colon cancer Neg Hx    Esophageal cancer Neg Hx    Rectal cancer Neg Hx    Colon polyps Neg Hx     Social History: Social History   Tobacco Use  Smoking Status Former   Current packs/day: 0.00   Types: Cigarettes   Quit date: 08/25/1978   Years since quitting: 45.2  Smokeless Tobacco Never   Social History    Substance and Sexual Activity  Alcohol Use Not Currently   Alcohol/week: 0.0 - 2.0 standard drinks of alcohol   Social History   Substance and Sexual  Activity  Drug Use No    Allergies: No Known Allergies  Medications: Current Outpatient Medications  Medication Sig Dispense Refill   estradiol (VIVELLE-DOT) 0.05 MG/24HR patch Place 1 patch onto the skin 2 (two) times a week.     Fiber POWD Take 1 Dose by mouth daily at 2 am. Prebiotic fiber supplement     loratadine (CLARITIN) 10 MG tablet Take 10 mg by mouth daily.     Multiple Vitamins-Minerals (CENTRUM SILVER 50+WOMEN) TABS Take 1 tablet by mouth daily at 6 (six) AM.     pantoprazole (PROTONIX) 40 MG tablet TAKE 1 TABLET BY MOUTH EVERY DAY 90 tablet 3   Propylene Glycol-Glycerin ([employer]S ARTIFICIAL TEARS) 1-0.3 % SOLN Apply 1 drop to eye daily. (Patient taking differently: Apply 1 drop to eye daily as needed.) 15 mL 0   rizatriptan (MAXALT) 10 MG tablet Take 1 tablet (10 mg total) by mouth as needed for migraine. May repeat in 2 hours if needed 10 tablet 3   rosuvastatin (CRESTOR) 10 MG tablet TAKE 1 TABLET BY MOUTH EVERY DAY 90 tablet 3   zolpidem (AMBIEN) 10 MG tablet Take 10 mg by mouth at bedtime as needed.     No current facility-administered medications for this visit.    Review of Systems: GENERAL: negative for malaise, night sweats HEENT: No changes in hearing or vision, no nose bleeds or other nasal problems. NECK: Negative for lumps, goiter, pain and significant neck swelling RESPIRATORY: Negative for cough, wheezing CARDIOVASCULAR: Negative for chest pain, leg swelling, palpitations, orthopnea GI: SEE HPI MUSCULOSKELETAL: Negative for joint pain or swelling, back pain, and muscle pain. SKIN: Negative for lesions, rash PSYCH: Negative for sleep disturbance, mood disorder and recent psychosocial stressors. HEMATOLOGY Negative for prolonged bleeding, bruising easily, and swollen nodes. ENDOCRINE: Negative for cold or  heat intolerance, polyuria, polydipsia and goiter. NEURO: negative for tremor, gait imbalance, syncope and seizures. The remainder of the review of systems is noncontributory.   Physical Exam: BP 123/85 (BP Location: Left Arm, Patient Position: Sitting, Cuff Size: Normal)   Pulse 84   Temp (!) 97.5 F (36.4 C) (Temporal)   Ht 5\' 8"  (1.727 m)   Wt 170 lb (77.1 kg)   BMI 25.85 kg/m  GENERAL: The patient is AO x3, in no acute distress. HEENT: Head is normocephalic and atraumatic. EOMI are intact. Mouth is well hydrated and without lesions. NECK: Supple. No masses LUNGS: Clear to auscultation. No presence of rhonchi/wheezing/rales. Adequate chest expansion HEART: RRR, normal s1 and s2. ABDOMEN:tender to palpation in the lower abdomen, no guarding, no peritoneal signs, and nondistended. BS +. No masses. EXTREMITIES: Without any cyanosis, clubbing, rash, lesions or edema. NEUROLOGIC: AOx3, no focal motor deficit. SKIN: no jaundice, no rashes   Imaging/Labs: as above  I personally reviewed and interpreted the available labs, imaging and endoscopic files.  Impression and Plan: [name] is a 68 y.o. female with past medical history of anxiety, basal cell carcinoma, depression, migraine, diverticulosis, osteoporosis, squamous cell carcinoma, who presents for evaluation of dysphagia, abdominal pain episodes, bloating and recurrent heartburn.  Patient has presented chronic issues of bloating for multiple years, along with intermittent episodes of severe abdominal pain recently that have been self-limited.  Abdominal pain episodes have been attributed to possible diverticulitis, although this has never been confirmed with cross-sectional abdominal imaging.  It is possible that part of her symptoms are related to IBS given the chronicity of the symptoms and lack of red flag signs, for which  she will benefit from implementing a low FODMAP diet.  We will check a celiac disease panel at this  point.  Regarding her episodes of heartburn and episodes dysphagia, we will need to evaluate this further with an EGD with possible repeat dilation and esophageal sampling.  If symptoms persist may consider proceeding with an esophageal manometry.  We will try a different PPI with stronger potency such as omeprazole at this point.  -Schedule EGD -Check celiac disease panel -Explained presumed etiology of IBS symptoms. Patient was counseled about the benefit of implementing a low FODMAP to improve symptoms and recurrent episodes. A dietary list was provided to the patient. Also, the patient was counseled about the benefit of avoiding stressing situations and potential environmental triggers leading to symptomatology. -Start omeprazole 40 mg qday, stop pantoprazole  All questions were answered.      Katrinka Blazing, MD Gastroenterology and Hepatology [facility]

## 2023-12-07 LAB — CELIAC DISEASE PANEL
(tTG) Ab, IgA: <1 U/mL
(tTG) Ab, IgG: <1 U/mL
Gliadin IgA: <1 U/mL
Gliadin IgG: <1 U/mL
Immunoglobulin A: 45 mg/dL — ABNORMAL LOW (ref 70–320)

## 2023-12-09 ENCOUNTER — Encounter (INDEPENDENT_AMBULATORY_CARE_PROVIDER_SITE_OTHER): Payer: Self-pay

## 2023-12-09 ENCOUNTER — Ambulatory Visit (HOSPITAL_COMMUNITY)
Admission: RE | Admit: 2023-12-09 | Discharge: 2023-12-09 | Disposition: A | Discharge status: Home / Self Care | Attending: Gastroenterology | Admitting: Gastroenterology

## 2023-12-09 ENCOUNTER — Encounter (HOSPITAL_COMMUNITY): Payer: Self-pay | Admitting: Gastroenterology

## 2023-12-09 ENCOUNTER — Other Ambulatory Visit (INDEPENDENT_AMBULATORY_CARE_PROVIDER_SITE_OTHER): Payer: Self-pay

## 2023-12-09 ENCOUNTER — Ambulatory Visit (HOSPITAL_COMMUNITY): Admitting: Anesthesiology

## 2023-12-09 ENCOUNTER — Ambulatory Visit (HOSPITAL_BASED_OUTPATIENT_CLINIC_OR_DEPARTMENT_OTHER): Admitting: Anesthesiology

## 2023-12-09 ENCOUNTER — Other Ambulatory Visit: Payer: Self-pay

## 2023-12-09 ENCOUNTER — Encounter (HOSPITAL_COMMUNITY)
Admission: RE | Disposition: A | Discharge status: Home / Self Care | Payer: Self-pay | Source: Home / Self Care | Attending: Gastroenterology

## 2023-12-09 DIAGNOSIS — R1310 Dysphagia, unspecified: Secondary | ICD-10-CM | POA: Diagnosis not present

## 2023-12-09 DIAGNOSIS — K219 Gastro-esophageal reflux disease without esophagitis: Secondary | ICD-10-CM | POA: Insufficient documentation

## 2023-12-09 DIAGNOSIS — M1990 Unspecified osteoarthritis, unspecified site: Secondary | ICD-10-CM | POA: Insufficient documentation

## 2023-12-09 DIAGNOSIS — K5730 Diverticulosis of large intestine without perforation or abscess without bleeding: Secondary | ICD-10-CM

## 2023-12-09 DIAGNOSIS — Z87891 Personal history of nicotine dependence: Secondary | ICD-10-CM | POA: Insufficient documentation

## 2023-12-09 DIAGNOSIS — K5792 Diverticulitis of intestine, part unspecified, without perforation or abscess without bleeding: Secondary | ICD-10-CM

## 2023-12-09 DIAGNOSIS — G43909 Migraine, unspecified, not intractable, without status migrainosus: Secondary | ICD-10-CM | POA: Diagnosis not present

## 2023-12-09 DIAGNOSIS — K317 Polyp of stomach and duodenum: Secondary | ICD-10-CM | POA: Insufficient documentation

## 2023-12-09 DIAGNOSIS — Z8719 Personal history of other diseases of the digestive system: Secondary | ICD-10-CM

## 2023-12-09 DIAGNOSIS — Z808 Family history of malignant neoplasm of other organs or systems: Secondary | ICD-10-CM

## 2023-12-09 DIAGNOSIS — K5900 Constipation, unspecified: Secondary | ICD-10-CM

## 2023-12-09 DIAGNOSIS — Z79899 Other long term (current) drug therapy: Secondary | ICD-10-CM | POA: Insufficient documentation

## 2023-12-09 DIAGNOSIS — K449 Diaphragmatic hernia without obstruction or gangrene: Secondary | ICD-10-CM | POA: Insufficient documentation

## 2023-12-09 HISTORY — PX: ESOPHAGOGASTRODUODENOSCOPY: SHX5428

## 2023-12-09 SURGERY — EGD (ESOPHAGOGASTRODUODENOSCOPY)
Anesthesia: General

## 2023-12-09 MED ORDER — LIDOCAINE HCL (CARDIAC) PF 100 MG/5ML IV SOSY
PREFILLED_SYRINGE | INTRAVENOUS | Status: DC | PRN
  Administered 2023-12-09: 60 mg via INTRATRACHEAL

## 2023-12-09 MED ORDER — LACTATED RINGERS IV SOLN: INTRAVENOUS | Status: DC | PRN

## 2023-12-09 MED ORDER — ONDANSETRON HCL 4 MG/2ML IJ SOLN: 4.0000 mg | Freq: Once | INTRAMUSCULAR | Status: DC | PRN

## 2023-12-09 MED ORDER — PROPOFOL 10 MG/ML IV BOLUS
INTRAVENOUS | Status: DC | PRN
Start: 2023-12-09 — End: 2023-12-09
  Administered 2023-12-09 (×2): 50 mg via INTRAVENOUS
  Administered 2023-12-09: 150 mg via INTRAVENOUS
  Administered 2023-12-09: 50 mg via INTRAVENOUS

## 2023-12-09 NOTE — Discharge Instructions (Signed)
You are being discharged to home.  ?Resume your previous diet.  ?We are waiting for your pathology results.  ?Continue your present medications.  ?

## 2023-12-09 NOTE — Transfer of Care (Signed)
 Immediate Anesthesia Transfer of Care Note  Patient: [name]  Procedure(s) Performed: EGD (ESOPHAGOGASTRODUODENOSCOPY)  Patient Location: Endoscopy Unit  Anesthesia Type:General  Level of Consciousness: drowsy and patient cooperative  Airway & Oxygen Therapy: Patient Spontanous Breathing  Post-op Assessment: Report given to RN and Post -op Vital signs reviewed and stable  Post vital signs: Reviewed and stable  Last Vitals:  Vitals Value Taken Time  BP 103/75 12/09/23 1057  Temp 36.4 C 12/09/23 1057  Pulse 77 12/09/23 1052  Resp 17 12/09/23 1052  SpO2 96 % 12/09/23 1057    Last Pain:  Vitals:   12/09/23 1057  TempSrc: Oral  PainSc: 0-No pain      Patients Stated Pain Goal: 7 (12/09/23 1004)  Complications: No notable events documented.

## 2023-12-09 NOTE — Anesthesia Preprocedure Evaluation (Signed)
 Anesthesia Evaluation  Patient identified by MRN, date of birth, ID band Patient awake    History of Anesthesia Complications (+) PONV and history of anesthetic complications  Airway Mallampati: II  TM Distance: >3 FB     Dental no notable dental hx. (+) Teeth Intact   Pulmonary former smoker   Pulmonary exam normal breath sounds clear to auscultation       Cardiovascular Exercise Tolerance: Good negative cardio ROS Normal cardiovascular exam Rhythm:Regular Rate:Normal     Neuro/Psych  Headaches PSYCHIATRIC DISORDERS Anxiety Depression       GI/Hepatic ,GERD  ,,  Endo/Other    Renal/GU   negative genitourinary   Musculoskeletal  (+) Arthritis ,    Abdominal Normal abdominal exam  (+)   Peds  Hematology   Anesthesia Other Findings   Reproductive/Obstetrics                             Anesthesia Physical Anesthesia Plan  ASA: 2  Anesthesia Plan: General   Post-op Pain Management:    Induction: Intravenous  PONV Risk Score and Plan: 1 and Propofol infusion  Airway Management Planned: Nasal Cannula  Additional Equipment:   Intra-op Plan:   Post-operative Plan:   Informed Consent: I have reviewed the patients History and Physical, chart, labs and discussed the procedure including the risks, benefits and alternatives for the proposed anesthesia with the patient or authorized representative who has indicated his/her understanding and acceptance.       Plan Discussed with: CRNA  Anesthesia Plan Comments:        Anesthesia Quick Evaluation

## 2023-12-09 NOTE — Anesthesia Procedure Notes (Signed)
 Date/Time: 12/09/2023 10:32 AM  Performed by: Verline Glow, CRNAPre-anesthesia Checklist: Patient identified, Emergency Drugs available, Suction available, Timeout performed and Patient being monitored Patient Re-evaluated:Patient Re-evaluated prior to induction Oxygen Delivery Method: Nasal Cannula

## 2023-12-09 NOTE — Anesthesia Postprocedure Evaluation (Signed)
 Anesthesia Post Note  Patient: [name]  Procedure(s) Performed: EGD (ESOPHAGOGASTRODUODENOSCOPY)  Patient location during evaluation: PACU Anesthesia Type: General Level of consciousness: awake and alert Pain management: pain level controlled Vital Signs Assessment: post-procedure vital signs reviewed and stable Respiratory status: spontaneous breathing, nonlabored ventilation, respiratory function stable and patient connected to nasal cannula oxygen Cardiovascular status: stable and blood pressure returned to baseline Postop Assessment: no apparent nausea or vomiting Anesthetic complications: no  No notable events documented.   Last Vitals:  Vitals:   12/09/23 1052 12/09/23 1057  BP: 101/73 103/75  Pulse: 77   Resp: 17   Temp:  36.4 C  SpO2: 97% 96%    Last Pain:  Vitals:   12/09/23 1057  TempSrc: Oral  PainSc: 0-No pain                 Beacher Limerick

## 2023-12-09 NOTE — Interval H&P Note (Signed)
 History and Physical Interval Note:  12/09/2023 9:51 AM  [name]  has presented today for surgery, with the diagnosis of DYSPHAGIA.  The various methods of treatment have been discussed with the patient and family. After consideration of risks, benefits and other options for treatment, the patient has consented to  Procedure(s) with comments: EGD (ESOPHAGOGASTRODUODENOSCOPY) (N/A) - 11:30AM;ASA 1 as a surgical intervention.  The patient's history has been reviewed, patient examined, no change in status, stable for surgery.  I have reviewed the patient's chart and labs.  Questions were answered to the patient's satisfaction.     [name]

## 2023-12-09 NOTE — Op Note (Signed)
 [facility] Patient Name: [name] Procedure Date: 12/09/2023 10:17 AM MRN: [id number] Date of Birth: 1956/06/20 Attending MD: Samantha Cress , , [id number] CSN: [id number] Age: 68 Admit Type: Outpatient Procedure:                Upper GI endoscopy Indications:              Dysphagia, Abdominal bloating Providers:                Samantha Cress, Karyle Pagoda, RN, Sharlette Dayhoff                            Technician, Technician, [place] Wilson, Technician Referring MD:              Medicines:                Monitored Anesthesia Care Complications:            No immediate complications. Estimated Blood Loss:     Estimated blood loss: none. Procedure:                Pre-Anesthesia Assessment:                           - Prior to the procedure, a History and Physical                            was performed, and patient medications, allergies                            and sensitivities were reviewed. The patient's                            tolerance of previous anesthesia was reviewed.                           - The risks and benefits of the procedure and the                            sedation options and risks were discussed with the                            patient. All questions were answered and informed                            consent was obtained.                           - ASA Grade Assessment: II - A patient with mild                            systemic disease.                           After obtaining informed consent, the endoscope was                            passed under direct vision. Throughout  the                            procedure, the patient's blood pressure, pulse, and                            oxygen saturations were monitored continuously. The                            [id number]-[id number] ([id number]) scope was introduced through the                            mouth, and advanced to the second part of duodenum.                            The upper GI  endoscopy was accomplished without                            difficulty. The patient tolerated the procedure                            well. Scope In: 10:39:18 AM Scope Out: 10:48:34 AM Total Procedure Duration: 0 hours 9 minutes 16 seconds  Findings:      No endoscopic abnormality was evident in the esophagus to explain the       patient's complaint of dysphagia. It was decided, however, to proceed       with dilation of the entire esophagus. A guidewire was placed and the       scope was withdrawn. Dilation was performed with a Savary dilator with       mild resistance at 18 mm. The dilation site was examined following       endoscope reinsertion and showed mild mucosal disruption.      A 3 cm hiatal hernia was present.      A single 14 mm sessile polyp with no bleeding and no stigmata of recent       bleeding was found in the gastric body. The polyp was removed with a       cold snare. Resection and retrieval were complete.      The examined duodenum was normal. Biopsies were taken with a cold       forceps for histology. Impression:               - No endoscopic esophageal abnormality to explain                            patient's dysphagia. Esophagus dilated. Dilated.                           - 3 cm hiatal hernia.                           - A single gastric polyp. Resected and retrieved.                           - Normal examined duodenum. Biopsied. Moderate Sedation:      Per Anesthesia Care Recommendation:           -  Discharge patient to home (ambulatory).                           - Resume previous diet.                           - Await pathology results.                           - Continue present medications. Procedure Code(s):        --- Professional ---                           [phone], Esophagogastroduodenoscopy, flexible,                            transoral; with removal of tumor(s), polyp(s), or                            other lesion(s) by snare technique                            43248, Esophagogastroduodenoscopy, flexible,                            transoral; with insertion of guide wire followed by                            passage of dilator(s) through esophagus over guide                            wire                           43239, 59, Esophagogastroduodenoscopy, flexible,                            transoral; with biopsy, single or multiple Diagnosis Code(s):        --- Professional ---                           R13.10, Dysphagia, unspecified                           K44.9, Diaphragmatic hernia without obstruction or                            gangrene                           K31.7, Polyp of stomach and duodenum                           R14.0, Abdominal distension (gaseous) CPT copyright 2022 American [employer] Association. All rights reserved. The codes documented in this report are preliminary and upon coder review may  be revised to meet current compliance requirements. Samantha Cress, MD Samantha Cress,  12/09/2023 10:59:19 AM This report has been signed electronically.  Number of Addenda: 0

## 2023-12-10 ENCOUNTER — Encounter (HOSPITAL_COMMUNITY): Payer: Self-pay | Admitting: Gastroenterology

## 2023-12-10 LAB — SURGICAL PATHOLOGY

## 2023-12-14 ENCOUNTER — Encounter (INDEPENDENT_AMBULATORY_CARE_PROVIDER_SITE_OTHER): Payer: Self-pay | Admitting: *Deleted

## 2023-12-24 LAB — IGM: IgM, Serum: 42 mg/dL — ABNORMAL LOW (ref 50–300)

## 2023-12-24 LAB — IGG: IgG (Immunoglobin G), Serum: 871 mg/dL (ref 600–1540)

## 2023-12-25 ENCOUNTER — Encounter: Payer: Self-pay | Admitting: Oncology

## 2023-12-25 ENCOUNTER — Inpatient Hospital Stay: Attending: Oncology | Admitting: Oncology

## 2023-12-25 ENCOUNTER — Inpatient Hospital Stay

## 2023-12-25 VITALS — BP 116/80 | HR 69 | Temp 98.0°F | Resp 16 | Ht 68.0 in | Wt 170.8 lb

## 2023-12-25 DIAGNOSIS — D801 Nonfamilial hypogammaglobulinemia: Secondary | ICD-10-CM

## 2023-12-25 DIAGNOSIS — M810 Age-related osteoporosis without current pathological fracture: Secondary | ICD-10-CM | POA: Diagnosis not present

## 2023-12-25 NOTE — Assessment & Plan Note (Signed)
 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 Patient has no history of increased infections, no B symptoms at this time. CBC, CMP: Within normal limits Patient has appropriate response to immunization as per history  - Discussed with patient that this findings can be nonspecific.  As long as she is asymptomatic we can hold off on further workup. - As IgG levels are normal, CVID cannot be ruled out. - Will repeat immunoglobulin levels in 3 months, if they continue to trend down or if IgM is low [less than 30] we will workup for other conditions that can cause this including CLL/lymphoma, multiple myeloma  Return to clinic in 3 months with labs

## 2023-12-25 NOTE — Patient Instructions (Signed)
 VISIT SUMMARY:  Today, we discussed your low immunoglobulin levels and your history of osteoporosis. You reported feeling significant fatigue and occasional chills and night sweats. We reviewed your current medications and lifestyle, including your hormone replacement therapy and past treatments for osteoporosis.  YOUR PLAN:  -LOW IMMUNOGLOBULIN LEVELS: Low immunoglobulin levels mean that your body has lower levels of certain antibodies, which can affect your immune system. Currently, your levels are slightly low but not causing significant symptoms or frequent infections. We will repeat your immunoglobulin levels in three months and consider further tests if your IgM levels drop below 20-30. Please monitor for any symptoms of frequent infections or other concerning signs.   INSTRUCTIONS:  Please repeat immunoglobulin levels in three months. Monitor for any symptoms of frequent infections or other concerning signs.

## 2023-12-25 NOTE — Progress Notes (Signed)
 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 female referred for immunodeficiency. The patient is accompanied by her husband today.  She has a past medical history of osteoporosis, GERD, migraine, hyperlipidemia.  She was recently seen by Dr. Sammi Crick [gastroenterologist] for gastric symptoms and was worked up for celiac disease which revealed low IgA and IgM levels.  Patient denies frequent skin infections,pneumonia, recent illness, frequent fevers or unintended weight loss.  She reports significant fatigue, often feeling tired after breakfast and needing to return to bed.  She also reports falling asleep while reading a book.  She does take an Ambien before bedtime and has been sleeping okay. She also has a history of osteoporosis for which she is on treatment for.  She gets annual influenza vaccines and does not get influenza often.  She has no other complaints today, reports a long-term history of constipation.  Overall, patient feels well.  She is not a smoker, does not drink alcohol.  Lives with her husband and is retired.  She has no family history of autoimmune conditions.  I have reviewed the past medical history, past surgical history, social history and family history with the patient   ALLERGIES:  has no known allergies.  MEDICATIONS:  Current Outpatient Medications  Medication Sig Dispense Refill   estradiol (VIVELLE-DOT) 0.05 MG/24HR patch Place 1 patch onto the skin 2 (two) times a week.     Fiber POWD Take 1 Dose by mouth daily at 2 am. Prebiotic fiber supplement     loratadine (CLARITIN) 10 MG tablet Take 10 mg by mouth daily.     Magnesium Oxide (PHILLIPS PO)      Multiple Vitamins-Minerals (CENTRUM SILVER 50+WOMEN) TABS Take 1 tablet by mouth daily at 6 (six) AM.     omeprazole  (PRILOSEC) 40 MG capsule Take 1 capsule (40 mg  total) by mouth daily. 90 capsule 3   Probiotic Product (PROBIOTIC DAILY PO)      Propylene Glycol-Glycerin ([employer] ARTIFICIAL TEARS) 1-0.3 % SOLN Apply 1 drop to eye daily. 15 mL 0   rizatriptan  (MAXALT ) 10 MG tablet Take 1 tablet (10 mg total) by mouth as needed for migraine. May repeat in 2 hours if needed 10 tablet 3   rosuvastatin  (CRESTOR ) 10 MG tablet TAKE 1 TABLET BY MOUTH EVERY DAY 90 tablet 3   zolpidem (AMBIEN) 10 MG tablet Take 10 mg by mouth at bedtime as needed.     No current facility-administered medications for this visit.     REVIEW OF SYSTEMS:   Constitutional: Denies fevers, chills or night sweats Eyes: Denies blurriness of vision Ears, nose, mouth, throat, and face: Denies mucositis or sore throat Respiratory: Denies cough, dyspnea or wheezes Cardiovascular: Denies palpitation, chest discomfort or lower extremity swelling Gastrointestinal:  Denies nausea, heartburn or change in bowel habits Skin: Denies abnormal skin rashes Lymphatics: Denies new lymphadenopathy or easy bruising Neurological:Denies numbness, tingling or new weaknesses Behavioral/Psych: Mood is stable, no new changes  All other systems were reviewed with the patient and are negative.  PHYSICAL EXAMINATION:   Vitals:   12/25/23 0932  BP: 116/80  Pulse: 69  Resp: 16  Temp: 98 F (36.7 C)  SpO2: 99%    GENERAL:alert, no distress and comfortable SKIN: skin color, texture, turgor are normal, no rashes or significant lesions LYMPH:  no palpable lymphadenopathy in  the cervical, axillary or inguinal LUNGS: clear to auscultation and percussion with normal breathing effort HEART: regular rate & rhythm and no murmurs and no lower extremity edema ABDOMEN:abdomen soft, non-tender and normal bowel sounds Musculoskeletal:no cyanosis of digits and no clubbing  NEURO: alert & oriented x 3 with fluent speech  LABORATORY DATA:  I have reviewed the data as listed  Lab Results  Component Value Date   WBC  6.6 11/02/2023   NEUTROABS 2.9 11/02/2023   HGB 13.8 11/02/2023   HCT 42.4 11/02/2023   MCV 91 11/02/2023   PLT 328 11/02/2023      Chemistry      Component Value Date/Time   NA 140 11/02/2023 0850   K 5.2 11/02/2023 0850   CL 103 11/02/2023 0850   CO2 23 11/02/2023 0850   BUN 20 11/02/2023 0850   CREATININE 0.97 11/02/2023 0850      Component Value Date/Time   CALCIUM  9.3 11/02/2023 0850   ALKPHOS 65 11/02/2023 0850   AST 19 11/02/2023 0850   ALT 11 11/02/2023 0850   BILITOT 0.3 11/02/2023 0850      Latest Reference Range & Units 12/23/23 07:58  IgG (Immunoglobin G), Serum 600 - 1,540 mg/dL 784  IgM, Serum 50 - 696 mg/dL 42 (L)  (L): Data is abnormally low   Latest Reference Range & Units 12/03/23 08:12  Immunoglobulin A 70 - 320 mg/dL 45 (L)  (L): Data is abnormally low   ASSESSMENT & PLAN:  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 Patient has no history of increased infections, no B symptoms at this time. CBC, CMP: Within normal limits Patient has appropriate response to immunization as per history  - Discussed with patient that this findings can be nonspecific.  As long as she is asymptomatic we can hold off on further workup. - As IgG levels are normal, CVID cannot be ruled out. - Will repeat immunoglobulin levels in 3 months, if they continue to trend down or if IgM is low [less than 30] we will workup for other conditions that can cause this including CLL/lymphoma, multiple myeloma  Return to clinic in 3 months with labs   Orders Placed This Encounter  Procedures   IgG, IgA, IgM    Standing Status:   Future    Expected Date:   03/26/2024    Expiration Date:   12/24/2024    The total time spent in the appointment was 40 minutes encounter with patients including review of chart and various  tests results, discussions about plan of care and coordination of care plan   All questions were answered. The patient knows to call the clinic with any problems, questions or concerns. No barriers to learning was detected.   Eduardo Grade, MD 5/2/20251:12 PM

## 2024-02-23 ENCOUNTER — Ambulatory Visit (INDEPENDENT_AMBULATORY_CARE_PROVIDER_SITE_OTHER): Admitting: Gastroenterology

## 2024-02-23 ENCOUNTER — Encounter (INDEPENDENT_AMBULATORY_CARE_PROVIDER_SITE_OTHER): Payer: Self-pay | Admitting: Gastroenterology

## 2024-02-23 VITALS — BP 131/87 | HR 78 | Temp 97.1°F | Ht 67.5 in | Wt 167.4 lb

## 2024-02-23 DIAGNOSIS — R140 Abdominal distension (gaseous): Secondary | ICD-10-CM | POA: Diagnosis not present

## 2024-02-23 DIAGNOSIS — K219 Gastro-esophageal reflux disease without esophagitis: Secondary | ICD-10-CM

## 2024-02-23 DIAGNOSIS — R1032 Left lower quadrant pain: Secondary | ICD-10-CM | POA: Diagnosis not present

## 2024-02-23 DIAGNOSIS — K649 Unspecified hemorrhoids: Secondary | ICD-10-CM

## 2024-02-23 NOTE — Patient Instructions (Signed)
-  we will increase omeprazole  40mg  to twice daily -Avoid greasy, spicy, fried, citrus foods, and be mindful that caffeine, carbonated drinks, chocolate and alcohol can increase reflux symptoms Stay upright 2-3 hours after eating, prior to lying down and avoid eating late in the evenings. -be mindful of prebiotics with bloating -referral for SIBO/SIMO testing at [facility] -can try otc gas x or phazyme -please make me aware of recurrent LLQ pain, will need to get a CT if this recurs to confirm if this is diverticulitis  Follow up 4 months  It was a pleasure to see you today. I want to create trusting relationships with patients and provide genuine, compassionate, and quality care. I truly value your feedback! please be on the lookout for a survey regarding your visit with me today. I appreciate your input about our visit and your time in completing this!    Tine Mabee L. Noam Karaffa, MSN, APRN, AGNP-C Adult-Gerontology Nurse Practitioner [facility] at [facility]

## 2024-02-23 NOTE — Progress Notes (Addendum)
 Referring Provider: Tobie Suzzane POUR, MD Primary Care Physician:  Tobie Suzzane POUR, MD Primary GI Physician: Dr. Eartha   Chief Complaint  Patient presents with   Follow-up    Patient here today for for a follow up. She is having some issues with bloating, gas. She has had an episode of severe abdominal pain and chills the first of June. Omeprazole  40 mg once per day working well for patient.   HPI:   [name] is a 68 y.o. female with past medical history of anxiety, basal cell carcinoma, depression, migraine, diverticulosis, osteoporosis, squamous cell carcinoma   Patient presenting today for:  Follow up of GERD, bloating, recurrent LLQ pain, hemorrhoids   Last seen in April by Dr. Eartha, at that time having LLQ pain, every 2-3 months. Fevers, feeling fatigued. Ongoing dysphagia for years, esophageal spasms, taking protonix  40mg  daily, having frequent heartburn and belching as well as nausea. Endorsed chronic bloating, taking laxative nightly for constipation.   Recommended celiac panel, EGD, low FODMAP diet, omeprazole  40mg  daily  Celiac panel negative, IgA low, IgM and IgG also checked which revealed low IgM and borderline IgG, referred to hematology for possible CVID   Patient saw hematology for plans to repeat Immunoglobulins again in 3 months, if these trend down, will workup for other conditions such as CLL/lymphoma, MM  Present  Patient reports feeling better on omeprazole  but still having a lot of bloating, gas and belching.  These symptoms have been chronic for her. She is doing prebiotic fiber twice daily (doing x2 years) and yogurt nightly. She is not taking any gas pills for this. She notes that when she has more belching she drinks Dr. Nunzio which seems to help her expel more gas and feel better. She notes pressure in her chest maybe once per week, symptoms have lessened some with omeprazole , previously occurring almost nightly. Denies heartburn or acid  regurgitation.no dysphagia or odynophagia. No changes in appetite or weight loss. She continues on 1/2 phillips laxative tablet nightly, otherwise she notes she would not move her bowels, generally has a BM daily. She feels certain foods such as spicy stuff makes her more gassy/bloated, she also feels gas grilled foods make her more bloated. She tried to do low FODMAP guide after last visit but had trouble following it.   She did have another episode of LLQ pain back in early June where she had severe pain, fevers and chills. These episodes usually have her in the bed for a few days. She notes during these times she will have no BMs, she denies any constipation leading up to the episodes. No documented CT imaging of diverticulitis in the past but has been diagnosed with this. She denies rectal bleeding, has nausea when they occur but no vomiting. Episodes last 2-4 days and she will drink sprite and eat crackers until symptoms resolve.   Occasionally has some issues with hemorrhoids, uses preparation H PRN, some bleeding at times. She inquires about hemorrhoid banding. Has flare ups when she does get constipated.   Last EGD: 11/2023 - No endoscopic esophageal abnormality to explain                            patient's dysphagia. Esophagus dilated. Dilated.                           - 3 cm hiatal hernia.                           -  A single gastric polyp. Resected and retrieved.                           - Normal examined duodenum. Biopsied. A. SMALL BOWEL, BIOPSY:  Benign duodenal mucosa with no diagnostic abnormality  B. STOMACH, POLYPECTOMY:  Benign fundic gland polyp  Negative for intestinal metaplasia, dysplasia and carcinoma  Last Colonoscopy:04/11/2022 - Dr. Avram - The perianal and digital rectal examinations were normal. - Two sessile polyps were found in the ascending colon and cecum. The polyps were 1 to 2 mm in size. These polyps were removed with a cold biopsy forceps. Resection and  retrieval were complete. Verification of patient identification for the specimen was done. Estimated blood loss was minimal. - Multiple diverticula were found in the sigmoid colon. - The exam was otherwise without abnormality on direct and retroflexion views.   Pathology consistent with 2 tubular adenomas, recommended repeat colonoscopy in 2030.  Filed Weights   02/23/24 1012  Weight: 167 lb 6.4 oz (75.9 kg)     Past Medical History:  Diagnosis Date   Anxiety    Arthritis    BCC (basal cell carcinoma of skin) 11/22/1993   bcc left nasal bridge tx: curet, exc.   BCC (basal cell carcinoma of skin) 12/20/1993   bcc + margin left nasal bridge tx:exc.   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   scc in situ bowens left cheek   Seasonal allergies    Thyroid  nodule    per recent US , has enlarged, but being monitored now    Past Surgical History:  Procedure Laterality Date   ABDOMINAL HYSTERECTOMY N/A    [facility] 08/27/2020   BACK SURGERY  12/24/2006   Lumbosacral spine   COLONOSCOPY     ESOPHAGOGASTRODUODENOSCOPY N/A 12/09/2023   Procedure: EGD (ESOPHAGOGASTRODUODENOSCOPY);  Surgeon: Eartha Flavors, Toribio, MD;  Location: [facility];  Service: Gastroenterology;  Laterality: N/A;  11:30AM;ASA 1   NASAL SINUS SURGERY     neck injection N/A    rotator cuff surgery Right 11/25/2021   SPINE SURGERY N/A    [facility] 08/27/2020   thyroid  lumpectomy  08/26/1979   TONSILLECTOMY     UPPER GASTROINTESTINAL ENDOSCOPY     VAGINAL HYSTERECTOMY      Current Outpatient Medications  Medication Sig Dispense Refill   estradiol (VIVELLE-DOT) 0.05 MG/24HR patch Place 1 patch onto the skin 2 (two) times a week.     Fiber POWD Take 1 Dose by  mouth daily at 2 am. Prebiotic fiber supplement     loratadine (CLARITIN) 10 MG tablet Take 10 mg by mouth daily.     Magnesium Oxide (PHILLIPS PO)  (Patient taking differently: 1/2 at bedtime)     Multiple Vitamins-Minerals (CENTRUM SILVER 50+WOMEN) TABS Take 1 tablet by mouth daily at 6 (six) AM.     omeprazole  (PRILOSEC) 40 MG capsule Take 1 capsule (40 mg total) by mouth daily. 90 capsule 3   Propylene Glycol-Glycerin ([employer] ARTIFICIAL TEARS) 1-0.3 % SOLN Apply 1 drop to eye daily. 15 mL 0   rizatriptan  (MAXALT ) 10 MG tablet Take 1 tablet (10 mg total) by mouth as needed for  migraine. May repeat in 2 hours if needed 10 tablet 3   rosuvastatin  (CRESTOR ) 10 MG tablet TAKE 1 TABLET BY MOUTH EVERY DAY 90 tablet 3   zolpidem (AMBIEN) 10 MG tablet Take 10 mg by mouth at bedtime as needed.     No current facility-administered medications for this visit.    Allergies as of 02/23/2024   (No Known Allergies)    Social History   Socioeconomic History   Marital status: Married    Spouse name: Not on file   Number of children: 1   Years of education: Not on file   Highest education level: Associate degree: occupational, Scientist, product/process development, or vocational program  Occupational History   Occupation: retired  Tobacco Use   Smoking status: Former    Current packs/day: 0.00    Types: Cigarettes    Quit date: 08/25/1978    Years since quitting: 45.5   Smokeless tobacco: Never  Vaping Use   Vaping status: Never Used  Substance and Sexual Activity   Alcohol use: Not Currently    Alcohol/week: 0.0 - 2.0 standard drinks of alcohol   Drug use: No   Sexual activity: Yes    Birth control/protection: Surgical  Other Topics Concern   Not on file  Social History Narrative   Not on file   Social Drivers of Health   Financial Resource Strain: Low Risk  (10/30/2023)   Overall Financial Resource Strain (CARDIA)    Difficulty of Paying Living Expenses: Not very hard  Food Insecurity: No Food Insecurity  (12/25/2023)   Hunger Vital Sign    Worried About Running Out of Food in the Last Year: Never true    Ran Out of Food in the Last Year: Never true  Transportation Needs: No Transportation Needs (12/25/2023)   PRAPARE - Administrator, Civil Service (Medical): No    Lack of Transportation (Non-Medical): No  Physical Activity: Insufficiently Active (10/30/2023)   Exercise Vital Sign    Days of Exercise per Week: 4 days    Minutes of Exercise per Session: 30 min  Stress: Stress Concern Present (10/30/2023)   [employer] of Occupational Health - Occupational Stress Questionnaire    Feeling of Stress : To some extent  Social Connections: Moderately Integrated (10/30/2023)   Social Connection and Isolation Panel    Frequency of Communication with Friends and Family: Once a week    Frequency of Social Gatherings with Friends and Family: Once a week    Attends Religious Services: More than 4 times per year    Active Member of [employer] or Organizations: Yes    Attends Banker Meetings: 1 to 4 times per year    Marital Status: Married    Review of systems General: negative for malaise, night sweats, fever, chills, weight loss Neck: Negative for lumps, goiter, pain and significant neck swelling Resp: Negative for cough, wheezing, dyspnea at rest CV: Negative for chest pain, leg swelling, palpitations, orthopnea GI: denies melena, hematochezia, nausea, vomiting, diarrhea, dysphagia, odyonophagia, early satiety or unintentional weight loss. +constipation +bloating +belching +intermittent LLQ pain  MSK: Negative for joint pain or swelling, back pain, and muscle pain. Derm: Negative for itching or rash Psych: Denies depression, anxiety, memory loss, confusion. No homicidal or suicidal ideation.  Heme: Negative for prolonged bleeding, bruising easily, and swollen nodes. Endocrine: Negative for cold or heat intolerance, polyuria, polydipsia and goiter. Neuro: negative for tremor,  gait imbalance, syncope and seizures. The remainder of the review of systems  is noncontributory.  Physical Exam: BP 131/87 (BP Location: Left Arm, Patient Position: Sitting, Cuff Size: Normal)   Pulse 78   Temp (!) 97.1 F (36.2 C) (Temporal)   Ht 5' 7.5 (1.715 m)   Wt 167 lb 6.4 oz (75.9 kg)   BMI 25.83 kg/m  General:   Alert and oriented. No distress noted. Pleasant and cooperative.  Head:  Normocephalic and atraumatic. Eyes:  Conjuctiva clear without scleral icterus. Mouth:  Oral mucosa pink and moist. Good dentition. No lesions. Heart: Normal rate and rhythm, s1 and s2 heart sounds present.  Lungs: Clear lung sounds in all lobes. Respirations equal and unlabored. Abdomen:  +BS, soft, non-tender and non-distended. No rebound or guarding. No HSM or masses noted. Derm: No palmar erythema or jaundice Msk:  Symmetrical without gross deformities. Normal posture. Extremities:  Without edema. Neurologic:  Alert and  oriented x4 Psych:  Alert and cooperative. Normal mood and affect.  Invalid input(s): 6 MONTHS   ASSESSMENT: [name] is a 68 y.o. female presenting today for follow up of GERD, bloating, recurrent LLQ pain and hemorrhoids  GERD/bloating: somewhat better on omeprazole . No heartburn or acid regurgitation. Bloating, gas and belching continue though lessened some. She was unable to follow low FODMAP guide. Has had bloating and gas for years. Notably on prebiotic fiber which we discussed could be contributing. Celiac panel negative. Recent EGD as above with no findings to explain her symptoms. Recommend SIBO/SIMO testing, for now we can try increasing PPI to BID dosing to see if this helps as she had some improvement with change from protonix , if she does not notice improvement, will reduce back to daily PPI dosing.   Recurrent LLQ pain: recurrent over the past few years, notably no CT imaging done at time of symptoms though reports she has been treated for  diverticulitis in the past. Usually will feel significant pain, nausea, constipation that last for a few days and puts her in bed. Certainly could be diverticulitis, though would be important to have imaging done when symptoms are occurring, I advised her to let me know if this recurs so we can obtain STAT CT.   Hemorrhoids: inquired about banding. Has occasional flare up with irritation and bleeding, especially when more constipated. Using otc preparation H PRN, relatively recent Colonoscopy as above. Will provide banding brochure, we did discuss banding briefly, she will let me know if she is interested in pursuing this.    PLAN:  -increase to omeprazole  40mg  BID, if no improvement, resume daily dosing  -good reflux precautions -be mindful of prebiotics as these can cause bloating -SIBO/SIMO testing  -can try otc gas x or phazyme -pt to make me aware of recurrent LLQ pain, will need STAT CT  -hemorrhoid banding brochure provided, pt to let me know if she wants to pursue this  All questions were answered, patient verbalized understanding and is in agreement with plan as outlined above.    Follow Up: 4 months   Jamorian Dimaria L. Mariette, MSN, APRN, AGNP-C Adult-Gerontology Nurse Practitioner [facility]  I have reviewed the note and agree with the APP's assessment as described in this progress note  Toribio Fortune, MD Gastroenterology and Hepatology [facility]

## 2024-04-01 ENCOUNTER — Inpatient Hospital Stay: Attending: Oncology

## 2024-04-01 DIAGNOSIS — D801 Nonfamilial hypogammaglobulinemia: Secondary | ICD-10-CM | POA: Insufficient documentation

## 2024-04-01 DIAGNOSIS — R5383 Other fatigue: Secondary | ICD-10-CM | POA: Insufficient documentation

## 2024-04-01 DIAGNOSIS — M1990 Unspecified osteoarthritis, unspecified site: Secondary | ICD-10-CM | POA: Diagnosis not present

## 2024-04-02 LAB — IGG, IGA, IGM
IgA: 38 mg/dL — ABNORMAL LOW (ref 87–352)
IgG (Immunoglobin G), Serum: 864 mg/dL (ref 586–1602)
IgM (Immunoglobulin M), Srm: 38 mg/dL (ref 26–217)

## 2024-04-04 ENCOUNTER — Encounter: Payer: Self-pay | Admitting: Podiatry

## 2024-04-04 ENCOUNTER — Ambulatory Visit: Admitting: Podiatry

## 2024-04-04 ENCOUNTER — Ambulatory Visit (INDEPENDENT_AMBULATORY_CARE_PROVIDER_SITE_OTHER)

## 2024-04-04 DIAGNOSIS — M722 Plantar fascial fibromatosis: Secondary | ICD-10-CM

## 2024-04-04 DIAGNOSIS — M1990 Unspecified osteoarthritis, unspecified site: Secondary | ICD-10-CM | POA: Diagnosis not present

## 2024-04-04 DIAGNOSIS — G629 Polyneuropathy, unspecified: Secondary | ICD-10-CM | POA: Diagnosis not present

## 2024-04-04 NOTE — Progress Notes (Signed)
 Subjective:  Patient ID: [name], female    DOB: [birth year],  MRN: [id number]  Chief Complaint  Patient presents with   Foot Pain    My toes hurt.  On the bottom of my feet, the calluses hurt so bad to walk.   Nail Problem    My toenails grow up and rub my shoes, then they hurt and put holes in my shoes.    Discussed the use of AI scribe software for clinical note transcription with the patient, who gave verbal consent to proceed.  History of Present Illness [name] is a 68 year old female who presents with bilateral foot pain and calluses.  She has experienced foot pain for many years, which has become increasingly bothersome. The pain is described as aching in her toes and a constant need for foot massages. It is present in both feet, with no specific toe being more affected than others. She also reports painful calluses, particularly in certain areas of her feet.  She denies any history of osteoarthritis or rheumatoid arthritis but experiences burning, tingling, and numbness in her toes. She has not been diagnosed with neuropathy. Her toes sometimes feel cold and change color, turning almost black or bright red at times, which may indicate Raynaud's phenomenon.  Her medical history includes a recent diagnosis of hypogammaglobulinemia, identified during blood work initiated by a gastroenterologist to rule out celiac disease. She has undergone additional blood tests recently to monitor this condition.  She also reports issues with her toenails, particularly the right big toe, which is painful and causes holes in her shoes due to a bone spur. She has a history of losing her big toenails during high school. The left big toe is also affected but to a lesser extent. She suspects having ingrown toenails, particularly on the right side, and notes that her second toenail is arched and her pinky toenails grow straight out.      Objective:    Physical Exam VASCULAR: DP and  PT pulse palpable. Foot is warm and well-perfused. Capillary fill time is brisk. Good arterial blood flow to feet. DERMATOLOGIC: Normal skin turgor, texture, and temperature. No open lesions, rashes, or ulcerations. Calluses on feet.  Pincer nail deformity bilateral hallux and right second toenail NEUROLOGIC: Normal sensation to light touch and pressure. No paresthesias on examination. Sensation intact in feet.  Normal monofilament exam ORTHOPEDIC: Smooth pain-free range of motion of all examined joints. No ecchymosis or bruising. No gross deformity. No pain to palpation or movement of feet.   No images are attached to the encounter.    Results RADIOLOGY Foot X-ray: Mild osteoarthritis in toes, joint space narrowing, subungual exostosis bilateral hallux worse on right   Assessment:   1. Plantar fasciitis   2. Inflammatory arthritis   3. Peripheral polyneuropathy      Plan:  Patient was evaluated and treated and all questions answered.  Assessment and Plan Assessment & Plan Neuropathic pain of both feet Chronic neuropathic pain in both feet, likely due to neuropathy. Differential includes age-related neuropathy, diabetes, alcohol use, and inflammatory neuropathy related to hypogammaglobulinemia. Sensation intact on examination. No pain on palpation or movement of joints. Symptoms include burning, tingling, and color changes in toes, suggestive of Raynaud's phenomenon. - Order B12 and folate level tests - Order arthritis panel to check for rheumatoid arthritis and lupus - Consider referral to a neurologist if lab results indicate inflammatory neuropathy, rheumatology if inflammatory arthritis is found on lab work - Recommend  supportive shoes and orthotics to alleviate pressure points  Osteoarthritis of toes Mild osteoarthritis in toes with joint space narrowing on x-ray. No significant pain on manipulation of joints.  Calluses of feet Presence of calluses on feet, likely due to  pressure points from footwear. - Recommend supportive shoes and orthotics to alleviate pressure points  Bone spur of right and left big toes with associated ingrown toenails Bone spurs on x-ray of right and left big toes causing nail to press up and lead to ingrown toenails. Right big toe more severely affected. Significant pain and history of nail loss. Surgery involves nail removal, incision, bone spur shaving, and stitching. Potential for nail damage and regrowth time of 5-6 months. Outpatient procedure with recovery period of a couple of weeks before resuming normal activities. - Schedule outpatient surgery to remove bone spurs and address ingrown toenails on both big toes - Perform nail removal and bone spur shaving during surgery  Ingrown toenail of right second toe Ingrown toenail on right second toe causing pain. - Perform ingrown toenail removal on right second toe during surgery  Raynaud's phenomenon of feet Symptoms of Raynaud's phenomenon, including color changes in toes. Good arterial blood flow, but possible microscopic circulatory issues.  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 Further evaluation needed to determine impact on neuropathy. - Order B12 and folate level tests - Order arthritis panel to check for rheumatoid arthritis and lupus - Consider referral to a neurologist if lab results indicate inflammatory neuropathy    Surgical plan:  Procedure: - Bilateral hallux nail removal with medial and lateral permanent partial surgical matricectomy, excision of subungual exostosis, medial and lateral permanent partial surgical matricectomy of the right second toe  Location: - [facility]  Anesthesia plan: - Sedation with local  Postoperative pain plan: - Tylenol 1000 mg every 6 hours, ib tramadol 50 mg every 6 hours as needed   DVT prophylaxis: - None required  WB Restrictions / DME needs: -  [facility]AT    No follow-ups on file.

## 2024-04-07 ENCOUNTER — Inpatient Hospital Stay: Admitting: Oncology

## 2024-04-07 ENCOUNTER — Inpatient Hospital Stay

## 2024-04-07 VITALS — BP 116/90 | HR 75 | Temp 96.7°F | Resp 18 | Wt 164.4 lb

## 2024-04-07 DIAGNOSIS — D801 Nonfamilial hypogammaglobulinemia: Secondary | ICD-10-CM

## 2024-04-07 DIAGNOSIS — R5383 Other fatigue: Secondary | ICD-10-CM

## 2024-04-07 LAB — CBC WITH DIFFERENTIAL/PLATELET
Abs Immature Granulocytes: 0.01 K/uL (ref 0.00–0.07)
Basophils Absolute: 0 K/uL (ref 0.0–0.1)
Basophils Relative: 1 %
Eosinophils Absolute: 0.1 K/uL (ref 0.0–0.5)
Eosinophils Relative: 1 %
HCT: 41.3 % (ref 36.0–46.0)
Hemoglobin: 13.8 g/dL (ref 12.0–15.0)
Immature Granulocytes: 0 %
Lymphocytes Relative: 46 %
Lymphs Abs: 2.8 K/uL (ref 0.7–4.0)
MCH: 30.8 pg (ref 26.0–34.0)
MCHC: 33.4 g/dL (ref 30.0–36.0)
MCV: 92.2 fL (ref 80.0–100.0)
Monocytes Absolute: 0.4 K/uL (ref 0.1–1.0)
Monocytes Relative: 7 %
Neutro Abs: 2.7 K/uL (ref 1.7–7.7)
Neutrophils Relative %: 45 %
Platelets: 308 K/uL (ref 150–400)
RBC: 4.48 MIL/uL (ref 3.87–5.11)
RDW: 13 % (ref 11.5–15.5)
WBC: 6.1 K/uL (ref 4.0–10.5)
nRBC: 0 % (ref 0.0–0.2)

## 2024-04-07 LAB — IRON AND TIBC
Iron: 79 ug/dL (ref 28–170)
Saturation Ratios: 25 % (ref 10.4–31.8)
TIBC: 314 ug/dL (ref 250–450)
UIBC: 235 ug/dL

## 2024-04-07 LAB — VITAMIN B12: Vitamin B-12: 516 pg/mL (ref 180–914)

## 2024-04-07 LAB — FERRITIN: Ferritin: 42 ng/mL (ref 11–307)

## 2024-04-07 LAB — FOLATE: Folate: >40 ng/mL (ref >5.9)

## 2024-04-07 NOTE — Progress Notes (Addendum)
 [facility] at [facility]  HEMATOLOGY FOLLOW-UP VISIT  Tammy Suzzane POUR, MD  REASON FOR FOLLOW-UP: Hypogammaglobulinemia   ASSESSMENT & PLAN:  Patient is a 68 y.o. female following for hypogammaglobulinemia  Assessment & Plan Hypogammaglobulinemia [facility]) Patient had low IgA and IgM levels on recent labs.  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 Patient has no history of increased infections, no B symptoms at this time. CBC, CMP: Within normal limits Patient has appropriate response to immunization as per history Labs reviewed today, more or less stable  - Discussed with patient that this findings can be nonspecific.  As long as she is asymptomatic we can hold off on further workup. - Her podiatrist is doing some autoimmune workup today, will follow up on that. - As IgG levels are normal, CVID can be ruled out.  Addendum: Reviewed autoimmune workup and it was all negative Other fatigue Patient reports fatigue.  - Will obtain iron panel, ferritin, folate and vitamin B12 levels today.  If all the labs are normal, will discharge from hematology clinic to be followed with primary care.     Orders Placed This Encounter  Procedures   Ferritin    Standing Status:   Future    Number of Occurrences:   1    Expected Date:   04/07/2024    Expiration Date:   07/06/2024   Folate    Standing Status:   Future    Number of Occurrences:   1    Expected Date:   04/07/2024    Expiration Date:   07/06/2024   Vitamin B12    Standing Status:   Future    Number of Occurrences:   1    Expected Date:   04/07/2024    Expiration Date:   07/06/2024   CBC with Differential/Platelet    Standing Status:   Future    Number of Occurrences:   1    Expected Date:   04/07/2024    Expiration Date:   07/06/2024   Iron and TIBC    Standing Status:   Future    Number of Occurrences:   1    Expected Date:   04/07/2024    Expiration Date:   07/06/2024     The total time spent in the appointment was 20 minutes encounter with patients including review of chart and various tests results, discussions about plan of care and coordination of care plan   All questions were answered. The patient knows to call the clinic with any problems, questions or concerns. No barriers to learning was detected.   [name],acting as a Neurosurgeon for Tammy Dry, MD.,have documented all relevant documentation on the behalf of Tammy Dry, MD,as directed by  Tammy Dry, MD while in the presence of Tammy Dry, MD.  I, Tammy Dry MD, have reviewed the above documentation for accuracy and completeness, and I agree with the above.    Tammy Dry, MD 8/25/20259:56 PM    INTERVAL HISTORY: [name] 68 y.o. female following for Hypogammaglobulinemia.   She reports fatigue today, which is chronic. She denies any fevers, chills, night sweats, unintentional weight loss, or loss of appetite. [name] denies any hematochezia. She notes a history of night sweats that resolved after starting an estrogen patch.   She reports arthritic pain in the bilateral feet and hands as well as the neck. She is being evaluated by podiatry for possible rheumatoid arthritis and recently had blood work done.  [name] was seen by Mitzie Boettcher, NP on 02/23/2024 for follow-up and was told to increase omeprazole  to 40 mg BID. She is being worked up for SIBO.   I have reviewed the past medical history, past surgical history, social history and family history with the patient   ALLERGIES:  has no known allergies.  MEDICATIONS:  Current Outpatient Medications  Medication Sig Dispense Refill   estradiol (VIVELLE-DOT) 0.05 MG/24HR patch Place 1 patch onto the skin 2 (two) times a week.     Fiber POWD Take 1 Dose by mouth daily at 2 am. Prebiotic fiber supplement     loratadine (CLARITIN) 10 MG tablet Take 10 mg by mouth daily.     Magnesium Oxide (PHILLIPS PO)   (Patient taking differently: 1/2 at bedtime)     Multiple Vitamins-Minerals (CENTRUM SILVER 50+WOMEN) TABS Take 1 tablet by mouth daily at 6 (six) AM.     Propylene Glycol-Glycerin ([employer] ARTIFICIAL TEARS) 1-0.3 % SOLN Apply 1 drop to eye daily. 15 mL 0   rizatriptan  (MAXALT ) 10 MG tablet Take 1 tablet (10 mg total) by mouth as needed for migraine. May repeat in 2 hours if needed 10 tablet 3   rosuvastatin  (CRESTOR ) 10 MG tablet TAKE 1 TABLET BY MOUTH EVERY DAY 90 tablet 3   zolpidem (AMBIEN) 10 MG tablet Take 10 mg by mouth at bedtime as needed.     omeprazole  (PRILOSEC) 40 MG capsule Take 1 capsule (40 mg total) by mouth 2 (two) times daily. 180 capsule 3   No current facility-administered medications for this visit.    REVIEW OF SYSTEMS:   Constitutional: Denies fevers, chills or night sweats Eyes: Denies blurriness of vision Ears, nose, mouth, throat, and face: Denies mucositis or sore throat Respiratory: Denies cough, dyspnea or wheezes Cardiovascular: Denies palpitation, chest discomfort or lower extremity swelling Gastrointestinal:  Denies nausea, heartburn or change in bowel habits Skin: Denies abnormal skin rashes Lymphatics: Denies new lymphadenopathy or easy bruising Neurological:Denies numbness, tingling or new weaknesses Behavioral/Psych: Mood is stable, no new changes  All other systems were reviewed with the patient and are negative.  PHYSICAL EXAMINATION:   Vitals:   04/07/24 1034  BP: (!) 116/90  Pulse: 75  Resp: 18  Temp: (!) 96.7 F (35.9 C)  SpO2: 100%    GENERAL:alert, no distress and comfortable SKIN: skin color, texture, turgor are normal, no rashes or significant lesions LYMPH:  no palpable lymphadenopathy in the cervical, axillary or inguinal LUNGS: clear to auscultation and percussion with normal breathing effort HEART: regular rate & rhythm and no murmurs and no lower extremity edema ABDOMEN:abdomen soft, non-tender and normal bowel  sounds Musculoskeletal:no cyanosis of digits and no clubbing  NEURO: alert & oriented x 3 with fluent speech  LABORATORY DATA:  I have reviewed the data as listed  Lab Results  Component Value Date   WBC 6.1 04/07/2024   NEUTROABS 2.7 04/07/2024   HGB 13.8 04/07/2024   HCT 41.3 04/07/2024   MCV 92.2 04/07/2024   PLT 308 04/07/2024    RADIOGRAPHIC STUDIES: I have personally reviewed the radiological images as listed and agreed with the findings in the report.  None relevant to review

## 2024-04-07 NOTE — Assessment & Plan Note (Addendum)
 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 Patient has no history of increased infections, no B symptoms at this time. CBC, CMP: Within normal limits Patient has appropriate response to immunization as per history Labs reviewed today, more or less stable  - Discussed with patient that this findings can be nonspecific.  As long as she is asymptomatic we can hold off on further workup. - Her podiatrist is doing some autoimmune workup today, will follow up on that. - As IgG levels are normal, CVID can be ruled out.  Addendum: Reviewed autoimmune workup and it was all negative

## 2024-04-07 NOTE — Patient Instructions (Signed)
 [facility] at [facility] Discharge Instructions   You were seen and examined today by Dr. Davonna.  She reviewed the results of your lab work which are normal/stable.   We see you back as needed in the clinic. You do not require any follow up here at this time.   Return as needed.    Thank you for choosing [facility] at [facility] to provide your oncology and hematology care.  To afford each patient quality time with our provider, please arrive at least 15 minutes before your scheduled appointment time.   If you have a lab appointment with the Cancer Center please come in thru the Main Entrance and check in at the main information desk.  You need to re-schedule your appointment should you arrive 10 or more minutes late.  We strive to give you quality time with our providers, and arriving late affects you and other patients whose appointments are after yours.  Also, if you no show three or more times for appointments you may be dismissed from the clinic at the providers discretion.     Again, thank you for choosing [facility]  Our hope is that these requests will decrease the amount of time that you wait before being seen by our physicians.       _____________________________________________________________  Should you have questions after your visit to [facility], please contact our office at [phone] and follow the prompts.  Our office hours are 8:00 a.m. and 4:30 p.m. Monday - Friday.  Please note that voicemails left after 4:00 p.m. may not be returned until the following business day.  We are closed weekends and major holidays.  You do have access to a nurse 24-7, just call the main number to the clinic [phone] and do not press any options, hold on the line and a nurse will answer the phone.    For prescription refill requests, have your pharmacy contact our office and allow 72 hours.    Due to Covid,  you will need to wear a mask upon entering the hospital. If you do not have a mask, a mask will be given to you at the Main Entrance upon arrival. For doctor visits, patients may have 1 support person age 67 or older with them. For treatment visits, patients can not have anyone with them due to social distancing guidelines and our immunocompromised population.

## 2024-04-08 ENCOUNTER — Ambulatory Visit: Admitting: Oncology

## 2024-04-09 ENCOUNTER — Other Ambulatory Visit (INDEPENDENT_AMBULATORY_CARE_PROVIDER_SITE_OTHER): Payer: Self-pay | Admitting: Gastroenterology

## 2024-04-09 DIAGNOSIS — K219 Gastro-esophageal reflux disease without esophagitis: Secondary | ICD-10-CM

## 2024-04-11 NOTE — Telephone Encounter (Signed)
 Per 02/23/2024 ov increase to bid.

## 2024-04-15 LAB — RHEUMATOID FACTOR: Rheumatoid fact SerPl-aCnc: <10 [IU]/mL (ref <14.0)

## 2024-04-15 LAB — HLA-B27 ANTIGEN

## 2024-04-15 LAB — ANTI-CCP AB, IGG + IGA (RDL): Anti-CCP Ab, IgG + IgA (RDL): <20 U (ref <20)

## 2024-04-15 LAB — URIC ACID: Uric Acid: 3.6 mg/dL (ref 3.0–7.2)

## 2024-04-15 LAB — VITAMIN B12: Vitamin B-12: 771 pg/mL (ref 232–1245)

## 2024-04-15 LAB — FOLATE: Folate: >20 ng/mL (ref >3.0)

## 2024-04-15 LAB — SEDIMENTATION RATE: Sed Rate: 2 mm/h (ref 0–40)

## 2024-04-18 ENCOUNTER — Telehealth: Payer: Self-pay | Admitting: Oncology

## 2024-04-19 NOTE — Telephone Encounter (Signed)
 Opened in error

## 2024-04-26 ENCOUNTER — Telehealth: Payer: Self-pay | Admitting: Podiatry

## 2024-04-26 NOTE — Telephone Encounter (Signed)
 DOS- 05/20/2024  GREAT TOE INGROWN REMOVAL BIL- 88249 2ND RT INGROWN REMOVAL RT- 11750  [employer] EFFECTIVE DATE- 08/26/2023  DEDUCTIBLE- $125 REMAINING- $0 OOP- $900 REMAINING- $113.42 COINSURANCE- 0%  PER [employer] WEBSITE, NO PRIOR AUTH IS REQUIRED FOR CPT CODE 11750 (3 UNITS). DECISION ID# [id number]

## 2024-05-02 ENCOUNTER — Ambulatory Visit: Payer: Self-pay | Admitting: Podiatry

## 2024-05-20 ENCOUNTER — Other Ambulatory Visit: Payer: Self-pay | Admitting: Podiatry

## 2024-05-20 DIAGNOSIS — M7751 Other enthesopathy of right foot: Secondary | ICD-10-CM

## 2024-05-20 DIAGNOSIS — L600 Ingrowing nail: Secondary | ICD-10-CM | POA: Diagnosis not present

## 2024-05-20 DIAGNOSIS — M7752 Other enthesopathy of left foot: Secondary | ICD-10-CM

## 2024-05-20 MED ORDER — TRAMADOL HCL 50 MG PO TABS: 50.0000 mg | ORAL_TABLET | Freq: Four times a day (QID) | ORAL | 0 refills | Status: DC | PRN

## 2024-05-20 MED ORDER — TRAMADOL HCL 50 MG PO TABS: 50.0000 mg | ORAL_TABLET | Freq: Four times a day (QID) | ORAL | 0 refills | Status: AC | PRN

## 2024-05-20 MED ORDER — ACETAMINOPHEN 500 MG PO TABS: 1000.0000 mg | ORAL_TABLET | Freq: Four times a day (QID) | ORAL | 0 refills | Status: AC | PRN

## 2024-05-20 MED ORDER — ACETAMINOPHEN 500 MG PO TABS: 1000.0000 mg | ORAL_TABLET | Freq: Four times a day (QID) | ORAL | 0 refills | Status: DC | PRN

## 2024-05-20 NOTE — Progress Notes (Signed)
 Issue obtaining post op medications, meds sent in.

## 2024-05-26 ENCOUNTER — Ambulatory Visit (INDEPENDENT_AMBULATORY_CARE_PROVIDER_SITE_OTHER): Admitting: Podiatry

## 2024-05-26 ENCOUNTER — Ambulatory Visit (INDEPENDENT_AMBULATORY_CARE_PROVIDER_SITE_OTHER)

## 2024-05-26 VITALS — Ht 67.5 in | Wt 164.4 lb

## 2024-05-26 DIAGNOSIS — M7752 Other enthesopathy of left foot: Secondary | ICD-10-CM

## 2024-05-26 DIAGNOSIS — M7751 Other enthesopathy of right foot: Secondary | ICD-10-CM

## 2024-05-26 NOTE — Progress Notes (Signed)
  Subjective:  Patient ID: [name], female    DOB: 1956-07-09,  MRN: [id number]  Chief Complaint  Patient presents with   Post-op Follow-up    RM 23 POV # 1 DOS 05/20/24 BIL GREAT INGROWN TOENAIL REMOVAL W/ BONE SPUR REMOVAL UNDER NAIL, 2ND RIGHT TOE INGROWN TOENAIL REMOVAL. Pt. States some numbness and pain in toes when resting.    68 y.o. female returns for post-op check.  Doing well was not able to tolerate tramadol   Review of Systems: Negative except as noted in the HPI. Denies N/V/F/Ch.   Objective:  There were no vitals filed for this visit. Body mass index is 25.37 kg/m. Constitutional Well developed. Well nourished.  Vascular Foot warm and well perfused. Capillary refill normal to all digits.  Calf is soft and supple, no posterior calf or knee pain, negative Homans' sign  Neurologic Normal speech. Oriented to person, place, and time. Epicritic sensation to light touch grossly present bilaterally.  Dermatologic Skin healing well without signs of infection. Skin edges well coapted without signs of infection.  Orthopedic: Tenderness to palpation noted about the surgical site.   Multiple view plain film radiographs: Interval distal phalanx reduction Assessment:   1. Bone spur of toe of right foot   2. Bone spur of toe of left foot    Plan:  Patient was evaluated and treated and all questions answered.  S/p foot surgery bilaterally -Progressing as expected post-operatively. -XR: Noted above no issues -WB Status: Weightbearing as tolerated in any shoe comfortable -Sutures: Removed in 2 weeks. -Medications: No refills required -Foot redressed.  No follow-ups on file.

## 2024-05-30 ENCOUNTER — Encounter: Admitting: Podiatry

## 2024-06-08 ENCOUNTER — Encounter (INDEPENDENT_AMBULATORY_CARE_PROVIDER_SITE_OTHER): Payer: Self-pay | Admitting: Gastroenterology

## 2024-06-09 ENCOUNTER — Ambulatory Visit (INDEPENDENT_AMBULATORY_CARE_PROVIDER_SITE_OTHER): Admitting: Podiatry

## 2024-06-09 DIAGNOSIS — Z9889 Other specified postprocedural states: Secondary | ICD-10-CM

## 2024-06-09 DIAGNOSIS — M7752 Other enthesopathy of left foot: Secondary | ICD-10-CM

## 2024-06-09 DIAGNOSIS — M7751 Other enthesopathy of right foot: Secondary | ICD-10-CM

## 2024-06-09 MED ORDER — SILVER SULFADIAZINE 1 % EX CREA: 1.0000 | TOPICAL_CREAM | Freq: Every day | CUTANEOUS | 0 refills | Status: AC

## 2024-06-09 NOTE — Progress Notes (Signed)
 Patient presents for post-op visit today, POV # 2 DOS 05/20/24 BIL GREAT INGROWN TOENAIL REMOVAL W/ BONE SPUR REMOVAL UNDER NAIL, 2ND RIGHT TOE INGROWN TOENAIL REMOVAL   I am much better, still tender. Has had blisters come up on there that look like they have blood in them..   Vital Signs: Today's Vitals   06/09/24 1017  PainSc: 0-No pain    Radiographs: []  Taken [x]  Not taken  Surgical Site Assessment:  - Dressing:  []  Minimal dry blood, intact []  Reinforced   []  Changed     -RN Notes: No dressing in place.   - Incision/Surgical site:  []  CDI (clean, dry, intact)  [x]  Mild erythema  [x]  Drainage noted   -RN Notes: small bumps with sanguinous drainage present on bilateral 1st.  - Swelling:  [x]  None  []  Mild  []  Moderate   []  Significant    - Bruising:  []  None  [x]  Present: very minimal on toes.   - Sutures/staples:  [x]  Intact  []  Removed Today  []  Plan to remove at next visit    -Cast/Splint/Pins: [x]  None []  Intact  []  Removed []  Plan to remove at next visit []  Replaced  -Signs of infection:  [x]  None  []  Present - Describe: n/a  -DME:    []  AFW []  Surgical shoe []  Cast  []  Splint  -Walking status:  [x]  Full WB  []  Partial WB  []  NWB  -Utilizing device:  [x]  None []  Knee Scooter []  Crutches []  Wheelchair    DVT assessment:  [x]  Denies symptoms []  Chest pain/SOB []  Pain in calf/redness/warmth   Redressed DSD and ace wrap. Educated on signs of infection, proper dressing care, pain management, and weight bearing status. Patient will contact provider with any new or worsening symptoms. The provider assessed the patient today and reviewed instructions regarding plan of care.   Healing well recommend using Silvadene cream Rx was sent for this.  Apply daily for the next 2 weeks.  Return in 3 weeks to reevaluate.

## 2024-06-28 ENCOUNTER — Ambulatory Visit (INDEPENDENT_AMBULATORY_CARE_PROVIDER_SITE_OTHER): Admitting: Gastroenterology

## 2024-06-28 ENCOUNTER — Encounter (INDEPENDENT_AMBULATORY_CARE_PROVIDER_SITE_OTHER): Payer: Self-pay | Admitting: Gastroenterology

## 2024-06-28 VITALS — BP 125/86 | HR 66 | Temp 97.1°F | Ht 68.0 in | Wt 161.5 lb

## 2024-06-28 DIAGNOSIS — R140 Abdominal distension (gaseous): Secondary | ICD-10-CM | POA: Diagnosis not present

## 2024-06-28 DIAGNOSIS — K219 Gastro-esophageal reflux disease without esophagitis: Secondary | ICD-10-CM | POA: Diagnosis not present

## 2024-06-28 NOTE — Progress Notes (Addendum)
 Referring Provider: Tobie Suzzane POUR, MD Primary Care Physician:  Tobie Suzzane POUR, MD Primary GI Physician: Dr. Eartha   Chief Complaint  Patient presents with   Follow-up    Patient here today for a follow up on bloating. Patient reports this is much better. She is taking omeprazole  40 mg bid, which has helped her.    HPI:   [name] is a 68 y.o. female with past medical history of f anxiety, basal cell carcinoma, depression, migraine, diverticulosis, osteoporosis, squamous cell carcinoma    Patient presenting today for:  Follow up of GERD, bloating  Last seen July, at that time doing better on omeprazole  but still having gas, bloating and belching. Doing prebiotic fiber BID, yogurt nightly. Not taking any gas pills, taking 1/2 laxative tablet nightly, certain foods tend to worsen bloating. Tried low FODMAP diet but had difficulty following. Some LLQ pain in June, symptoms lasted 2-4 days, no CT imaging done at that time. Having occasional hemorhoid flare, using preparation H  Recommended increasing PPI to BID, be mindful of prebiotics as these can cause bloating, SIBO/SIMO testing, otc gas x or phazyme, stat CT if recurrent LLQ pain, hemorrhoid banding brochure provided  SIBO/SIMO testing on 05/12/24: Negative  No increase in hydrogen or methane production to diagnostic levels over 2  hours.   Most recent labs in August with iron 79, tibc 314 sat 25 ferritin 42, hgb 13.8 b12 516 folate >40  Present:  Doing much better with increase in PPI, states bloating has improved quite a bit. Using phazyme PRN but has not needed in the last few weeks. Appetite is good. No nausea or vomiting. She has some bloating still maybe once per week, thinks it may depend on what she is eating. She thinks this improves with phazyme. She has been working on her diet to lose weight, cutting back on portions and breads and sodas, down about 9 pounds. No rectal bleeding or melena. She is still taking  prebiotic fiber.   Last EGD: 11/2023 - No endoscopic esophageal abnormality to explain                            patient's dysphagia. Esophagus dilated. Dilated.                           - 3 cm hiatal hernia.                           - A single gastric polyp. Resected and retrieved.                           - Normal examined duodenum. Biopsied. A. SMALL BOWEL, BIOPSY:  Benign duodenal mucosa with no diagnostic abnormality  B. STOMACH, POLYPECTOMY:  Benign fundic gland polyp  Negative for intestinal metaplasia, dysplasia and carcinoma   Last Colonoscopy:04/11/2022 - Dr. Avram - The perianal and digital rectal examinations were normal. - Two sessile polyps were found in the ascending colon and cecum. The polyps were 1 to 2 mm in size. These polyps were removed with a cold biopsy forceps. Resection and retrieval were complete. Verification of patient identification for the specimen was done. Estimated blood loss was minimal. - Multiple diverticula were found in the sigmoid colon. - The exam was otherwise without abnormality on direct and retroflexion views.  Pathology consistent with 2 tubular adenomas, recommended repeat colonoscopy in 2030. Filed Weights   06/28/24 1032  Weight: 161 lb 8 oz (73.3 kg)     Past Medical History:  Diagnosis Date   Anxiety    Arthritis    BCC (basal cell carcinoma of skin) 11/22/1993   bcc left nasal bridge tx: curet, exc.   BCC (basal cell carcinoma of skin) 12/20/1993   bcc + margin left nasal bridge tx:exc.   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   scc in situ bowens left cheek   Seasonal allergies    Thyroid  nodule    per recent US , has enlarged, but being monitored now    Past Surgical  History:  Procedure Laterality Date   ABDOMINAL HYSTERECTOMY N/A    [employer] 08/27/2020   BACK SURGERY  12/24/2006   Lumbosacral spine   COLONOSCOPY     ESOPHAGOGASTRODUODENOSCOPY N/A 12/09/2023   Procedure: EGD (ESOPHAGOGASTRODUODENOSCOPY);  Surgeon: Eartha Flavors, Toribio, MD;  Location: [facility];  Service: Gastroenterology;  Laterality: N/A;  11:30AM;ASA 1   NASAL SINUS SURGERY     neck injection N/A    rotator cuff surgery Right 11/25/2021   SPINE SURGERY N/A    [employer] 08/27/2020   thyroid  lumpectomy  08/26/1979   TONSILLECTOMY     UPPER GASTROINTESTINAL ENDOSCOPY     VAGINAL HYSTERECTOMY      Current Outpatient Medications  Medication Sig Dispense Refill   estradiol (VIVELLE-DOT) 0.05 MG/24HR patch Place 1 patch onto the skin 2 (two) times a week.     Fiber POWD Take 1 Dose by mouth daily at 2 am. Prebiotic fiber supplement     loratadine (CLARITIN) 10 MG tablet Take 10 mg by mouth daily.     Magnesium Oxide (PHILLIPS PO)  (Patient taking differently: 1/2 at bedtime)     Multiple Vitamins-Minerals (CENTRUM SILVER 50+WOMEN) TABS Take 1 tablet by mouth daily at 6 (six) AM.     omeprazole  (PRILOSEC) 40 MG capsule Take 1 capsule (40 mg total) by mouth 2 (two) times daily. 180 capsule 3   Propylene Glycol-Glycerin ([employer] ARTIFICIAL TEARS) 1-0.3 % SOLN Apply 1 drop to eye daily. (Patient taking differently: Apply 1 drop to eye daily. 1-2 drops daily.) 15 mL 0   rizatriptan  (MAXALT ) 10 MG tablet Take 1 tablet (10 mg total) by mouth as needed for migraine. May repeat in 2 hours if needed 10 tablet 3   rosuvastatin  (CRESTOR ) 10 MG tablet TAKE 1 TABLET BY MOUTH EVERY DAY 90 tablet 3   silver sulfADIAZINE (SILVADENE) 1 % cream Apply 1 Application topically daily. 25 g 0   zolpidem (AMBIEN) 10 MG tablet Take 10 mg by mouth at bedtime as needed.     No current facility-administered medications for this visit.    Allergies as of 06/28/2024   (No Known Allergies)    Social  History   Socioeconomic History   Marital status: Married    Spouse name: Not on file   Number of children: 1   Years of education: Not on file   Highest education level: Associate degree: occupational, scientist, product/process development, or vocational program  Occupational History   Occupation: retired  Tobacco  Use   Smoking status: Former    Current packs/day: 0.00    Types: Cigarettes    Quit date: 08/25/1978    Years since quitting: 45.8   Smokeless tobacco: Never  Vaping Use   Vaping status: Never Used  Substance and Sexual Activity   Alcohol use: Not Currently    Alcohol/week: 0.0 - 2.0 standard drinks of alcohol   Drug use: No   Sexual activity: Yes    Birth control/protection: Surgical  Other Topics Concern   Not on file  Social History Narrative   Not on file   Social Drivers of Health   Financial Resource Strain: Low Risk  (10/30/2023)   Overall Financial Resource Strain (CARDIA)    Difficulty of Paying Living Expenses: Not very hard  Food Insecurity: No Food Insecurity (12/25/2023)   Hunger Vital Sign    Worried About Running Out of Food in the Last Year: Never true    Ran Out of Food in the Last Year: Never true  Transportation Needs: No Transportation Needs (12/25/2023)   PRAPARE - Administrator, Civil Service (Medical): No    Lack of Transportation (Non-Medical): No  Physical Activity: Insufficiently Active (10/30/2023)   Exercise Vital Sign    Days of Exercise per Week: 4 days    Minutes of Exercise per Session: 30 min  Stress: Stress Concern Present (10/30/2023)   [employer] of Occupational Health - Occupational Stress Questionnaire    Feeling of Stress : To some extent  Social Connections: Moderately Integrated (10/30/2023)   Social Connection and Isolation Panel    Frequency of Communication with Friends and Family: Once a week    Frequency of Social Gatherings with Friends and Family: Once a week    Attends Religious Services: More than 4 times per year    Active  Member of [employer] or Organizations: Yes    Attends Banker Meetings: 1 to 4 times per year    Marital Status: Married    Review of systems General: negative for malaise, night sweats, fever, chills, weight loss Neck: Negative for lumps, goiter, pain and significant neck swelling Resp: Negative for cough, wheezing, dyspnea at rest CV: Negative for chest pain, leg swelling, palpitations, orthopnea GI: denies melena, hematochezia, nausea, vomiting, diarrhea, constipation, dysphagia, odyonophagia, early satiety or unintentional weight loss. +bloating  MSK: Negative for joint pain or swelling, back pain, and muscle pain. Derm: Negative for itching or rash Psych: Denies depression, anxiety, memory loss, confusion. No homicidal or suicidal ideation.  Heme: Negative for prolonged bleeding, bruising easily, and swollen nodes. Endocrine: Negative for cold or heat intolerance, polyuria, polydipsia and goiter. Neuro: negative for tremor, gait imbalance, syncope and seizures. The remainder of the review of systems is noncontributory.  Physical Exam: BP 125/86 (BP Location: Left Arm, Patient Position: Sitting, Cuff Size: Normal)   Pulse 66   Temp (!) 97.1 F (36.2 C) (Temporal)   Ht 5' 8 (1.727 m)   Wt 161 lb 8 oz (73.3 kg)   BMI 24.56 kg/m  General:   Alert and oriented. No distress noted. Pleasant and cooperative.  Head:  Normocephalic and atraumatic. Eyes:  Conjuctiva clear without scleral icterus. Mouth:  Oral mucosa pink and moist. Good dentition. No lesions. Heart: Normal rate and rhythm, s1 and s2 heart sounds present.  Lungs: Clear lung sounds in all lobes. Respirations equal and unlabored. Abdomen:  +BS, soft, non-tender and non-distended. No rebound or guarding. No HSM or masses noted. Derm: No  palmar erythema or jaundice Msk:  Symmetrical without gross deformities. Normal posture. Extremities:  Without edema. Neurologic:  Alert and  oriented x4 Psych:  Alert and  cooperative. Normal mood and affect.  Invalid input(s): 6 MONTHS   ASSESSMENT: [name] is a 68 y.o. female presenting today for follow up of GERD and bloating  Patient with history of GERD/bloating, unable to follow low FODMAP guide in the past. Has had bloating and gas for years. Notably on prebiotic fiber which we discussed previously could be contributing. Celiac panel negative. Recent EGD as above with no findings to explain her symptoms. SIBO/SIMO testing negative in September which we discussed at her visit today.    Bloating seems to have improved quite a bit with increase in PPI to BID, having symptoms about 1 time per week now, patient thinks it may be related to certain foods. At this time, recommend continuing PPI BID, keep journal of foods/symptoms to see if we can identify any dietary triggers, she can start a daily probiotic and continue to use phazyme as needed. Will consider sucraid testing if symptoms worsen again.    PLAN:  -continue omeprazole  40mg  BID -keep food journal with bloating to id food triggers -phazyme as needed -daily probiotic -consider sucraid testing if symptoms worsen again   All questions were answered, patient verbalized understanding and is in agreement with plan as outlined above.   Follow Up: 6 months   Lando Alcalde L. Mariette, MSN, APRN, AGNP-C Adult-Gerontology Nurse Practitioner [facility]  I have reviewed the note and agree with the APP's assessment as described in this progress note  May consider decreasing omeprazole  to 40/20 mg dosing in next appointment depending on symptom control.  Toribio Fortune, MD Gastroenterology and Hepatology [facility]

## 2024-06-28 NOTE — Patient Instructions (Signed)
 Continue omeprazole  40mg  twice daily Keep food journal for about 2 weeks to determine if there are dietary triggers contributing You can use phazyme as needed Can try a daily over the counter probiotic if you are not on one already  If symptoms worsen, let me know as we can pursue more testing  Follow up 6 months  It was a pleasure to see you today. I want to create trusting relationships with patients and provide genuine, compassionate, and quality care. I truly value your feedback! please be on the lookout for a survey regarding your visit with me today. I appreciate your input about our visit and your time in completing this!    Tyrea Froberg L. Rubens Cranston, MSN, APRN, AGNP-C Adult-Gerontology Nurse Practitioner [facility]

## 2024-06-30 ENCOUNTER — Ambulatory Visit (INDEPENDENT_AMBULATORY_CARE_PROVIDER_SITE_OTHER): Admitting: Podiatry

## 2024-06-30 VITALS — Ht 68.0 in | Wt 161.5 lb

## 2024-06-30 DIAGNOSIS — L600 Ingrowing nail: Secondary | ICD-10-CM

## 2024-06-30 DIAGNOSIS — M7751 Other enthesopathy of right foot: Secondary | ICD-10-CM

## 2024-06-30 DIAGNOSIS — M7752 Other enthesopathy of left foot: Secondary | ICD-10-CM

## 2024-06-30 NOTE — Progress Notes (Signed)
  Subjective:  Patient ID: [name], female    DOB: [birth year],  MRN: [id number]  Chief Complaint  Patient presents with   Post-op Follow-up    RM 6 POV # 3 DOS 05/20/24 BIL GREAT INGROWN TOENAIL REMOVAL W/ BONE SPUR REMOVAL UNDER NAIL, 2ND RIGHT TOE INGROWN TOENAIL REMOVAL    68 y.o. female returns for post-op check.  She is doing well pain has improved her scabs have not come off completely  Review of Systems: Negative except as noted in the HPI. Denies N/V/F/Ch.   Objective:  There were no vitals filed for this visit. Body mass index is 24.56 kg/m. Constitutional Well developed. Well nourished.  Vascular Foot warm and well perfused. Capillary refill normal to all digits.  Calf is soft and supple, no posterior calf or knee pain, negative Homans' sign  Neurologic Normal speech. Oriented to person, place, and time. Epicritic sensation to light touch grossly present bilaterally.  Dermatologic Matricectomy sites are well-healed there is some eschar remaining that is peeling off lifting no infection  Orthopedic: She has no pain to palpation noted about the surgical site.   Multiple view plain film radiographs: Interval distal phalanx reduction Assessment:   1. Bone spur of toe of right foot   2. Bone spur of toe of left foot   3. Ingrowing right great toenail   4. Ingrowing left great toenail    Plan:  Patient was evaluated and treated and all questions answered.  S/p foot surgery bilaterally - Doing well and nail avulsion sites are continuing to heal, has new nail growth we discussed this will take quite a bit of time for her to normalize.  Follow-up with me as needed okay to soak toes at this point does not need further ointment bandaging or dressing. No follow-ups on file.

## 2024-07-14 ENCOUNTER — Ambulatory Visit

## 2024-08-23 ENCOUNTER — Ambulatory Visit (INDEPENDENT_AMBULATORY_CARE_PROVIDER_SITE_OTHER)

## 2024-08-23 VITALS — Ht 68.0 in | Wt 160.0 lb

## 2024-08-23 DIAGNOSIS — Z0000 Encounter for general adult medical examination without abnormal findings: Secondary | ICD-10-CM | POA: Diagnosis not present

## 2024-08-23 NOTE — Progress Notes (Signed)
 "  .HM Addressed: Last mammogram and bone denisty reports requested from Dr. Leva at [facility] Chief Complaint  Patient presents with   Medicare Wellness     Subjective:   [name] is a 68 y.o. female who presents for a Medicare Annual Wellness Visit.  Visit info / Clinical Intake: Medicare Wellness Visit Type:: Subsequent Annual Wellness Visit Persons participating in visit and providing information:: patient Medicare Wellness Visit Mode:: Video Since this visit was completed virtually, some vitals may be partially provided or unavailable. Missing vitals are due to the limitations of the virtual format.: Documented vitals are patient reported If Telephone or Video please confirm:: I connected with patient using audio/video enable telemedicine. I verified patient identity with two identifiers, discussed telehealth limitations, and patient agreed to proceed. Patient Location:: home Provider Location:: office Interpreter Needed?: No Pre-visit prep was completed: yes AWV questionnaire completed by patient prior to visit?: yes Date:: 08/19/24 Living arrangements:: (Patient-Rptd) lives with spouse/significant other Patient's Overall Health Status Rating: (Patient-Rptd) excellent Typical amount of pain: (Patient-Rptd) some Does pain affect daily life?: (Patient-Rptd) no Are you currently prescribed opioids?: no  Dietary Habits and Nutritional Risks How many meals a day?: (Patient-Rptd) 3 Eats fruit and vegetables daily?: (Patient-Rptd) yes Most meals are obtained by: (Patient-Rptd) preparing own meals In the last 2 weeks, have you had any of the following?: none Diabetic:: no  Functional Status Activities of Daily Living (to include ambulation/medication): (Patient-Rptd) Independent Ambulation: (Patient-Rptd) Independent Medication Administration: (Patient-Rptd) Independent Home Management (perform basic housework or laundry): (Patient-Rptd) Independent Manage  your own finances?: (Patient-Rptd) yes Primary transportation is: (Patient-Rptd) driving Concerns about vision?: no *vision screening is required for WTM* Concerns about hearing?: no  Fall Screening Falls in the past year?: (Patient-Rptd) 0 Number of falls in past year: 0 Was there an injury with Fall?: 0 Fall Risk Category Calculator: 0 Patient Fall Risk Level: Low Fall Risk  Fall Risk Patient at Risk for Falls Due to: No Fall Risks Fall risk Follow up: Falls evaluation completed; Education provided; Falls prevention discussed  Home and Transportation Safety: All rugs have non-skid backing?: (Patient-Rptd) yes All stairs or steps have railings?: (Patient-Rptd) yes Grab bars in the bathtub or shower?: (Patient-Rptd) yes Have non-skid surface in bathtub or shower?: (Patient-Rptd) yes Good home lighting?: (Patient-Rptd) yes Regular seat belt use?: (Patient-Rptd) yes Hospital stays in the last year:: (Patient-Rptd) no  Cognitive Assessment Difficulty concentrating, remembering, or making decisions? : (Patient-Rptd) no Will 6CIT or Mini Cog be Completed: yes What year is it?: 0 points What month is it?: 0 points Give patient an address phrase to remember (5 components): [address] [place] About what time is it?: 0 points Count backwards from 20 to 1: 0 points Say the months of the year in reverse: 0 points Repeat the address phrase from earlier: 0 points 6 CIT Score: 0 points  Advance Directives (For Healthcare) Does Patient Have a Medical Advance Directive?: No Would patient like information on creating a medical advance directive?: Yes (MAU/Ambulatory/Procedural Areas - Information given)  Reviewed/Updated  Reviewed/Updated: Reviewed All (Medical, Surgical, Family, Medications, Allergies, Care Teams, Patient Goals)    Allergies (verified) Patient has no known allergies.   Current Medications (verified) Outpatient Encounter Medications as of 08/23/2024   Medication Sig   estradiol (VIVELLE-DOT) 0.05 MG/24HR patch Place 1 patch onto the skin 2 (two) times a week.   Fiber POWD Take 1 Dose by mouth daily at 2 am. Prebiotic fiber supplement  loratadine (CLARITIN) 10 MG tablet Take 10 mg by mouth daily.   Magnesium Oxide (PHILLIPS PO)  (Patient taking differently: 1/2 at bedtime)   Multiple Vitamins-Minerals (CENTRUM SILVER  50+WOMEN) TABS Take 1 tablet by mouth daily at 6 (six) AM.   omeprazole  (PRILOSEC) 40 MG capsule Take 1 capsule (40 mg total) by mouth 2 (two) times daily.   Propylene Glycol-Glycerin ([employer] ARTIFICIAL TEARS) 1-0.3 % SOLN Apply 1 drop to eye daily. (Patient taking differently: Apply 1 drop to eye daily. 1-2 drops daily.)   rizatriptan  (MAXALT ) 10 MG tablet Take 1 tablet (10 mg total) by mouth as needed for migraine. May repeat in 2 hours if needed   rosuvastatin  (CRESTOR ) 10 MG tablet TAKE 1 TABLET BY MOUTH EVERY DAY   silver  sulfADIAZINE  (SILVADENE ) 1 % cream Apply 1 Application topically daily.   zolpidem (AMBIEN) 10 MG tablet Take 10 mg by mouth at bedtime as needed.   No facility-administered encounter medications on file as of 08/23/2024.    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   BCC (basal cell carcinoma of skin) 12/20/1993   bcc + margin left nasal bridge tx:exc.   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   scc in situ bowens left cheek   Seasonal allergies    Thyroid  nodule    per recent US , has enlarged, but being monitored now   Past Surgical History:  Procedure Laterality Date   ABDOMINAL HYSTERECTOMY N/A    [employer] 08/27/2020   BACK  SURGERY  12/24/2006   Lumbosacral spine   COLONOSCOPY     COSMETIC SURGERY  2002   Breast implants   ESOPHAGOGASTRODUODENOSCOPY N/A 12/09/2023   Procedure: EGD (ESOPHAGOGASTRODUODENOSCOPY);  Surgeon: Eartha Flavors, Toribio, MD;  Location: [facility];  Service: Gastroenterology;  Laterality: N/A;  11:30AM;ASA 1   NASAL SINUS SURGERY     neck injection N/A    rotator cuff surgery Right 11/25/2021   SPINE SURGERY N/A    [employer]reesia 08/27/2020   thyroid  lumpectomy  08/26/1979   TONSILLECTOMY     UPPER GASTROINTESTINAL ENDOSCOPY     VAGINAL HYSTERECTOMY     Family History  Problem Relation Age of Onset   Diabetes Mother    COPD Mother    Hypertension Mother    Kidney disease Mother    Cancer Mother    Asthma Mother    Arthritis Mother    Hearing loss Mother    Miscarriages / Stillbirths Mother    Vision loss Mother    Varicose Veins Mother    Heart attack Father    Migraines Father    Early death Father    Migraines Sister    Stomach cancer Maternal Grandmother    Other Other        brain tumor   Colon cancer Neg Hx    Esophageal cancer Neg Hx    Rectal cancer Neg Hx    Colon polyps Neg Hx    Social History   Occupational History   Occupation: retired  Tobacco Use  Smoking status: Former    Current packs/day: 0.00    Average packs/day: 0.5 packs/day for 4.0 years (2.0 ttl pk-yrs)    Types: Cigarettes    Quit date: 08/25/1978    Years since quitting: 46.0   Smokeless tobacco: Never   Tobacco comments:    Quit when got pregnant and never smoked again.  Vaping Use   Vaping status: Never Used  Substance and Sexual Activity   Alcohol use: Yes    Alcohol/week: 3.0 standard drinks of alcohol    Types: 3 Glasses of wine per week   Drug use: Never   Sexual activity: Not Currently    Birth control/protection: Surgical   Tobacco Counseling Counseling given: Yes Tobacco comments: Quit when got pregnant and never smoked again.  SDOH Screenings   Food  Insecurity: No Food Insecurity (08/19/2024)  Housing: Low Risk (08/19/2024)  Transportation Needs: No Transportation Needs (08/19/2024)  Utilities: Not At Risk (08/23/2024)  Alcohol Screen: Low Risk (08/19/2024)  Depression (PHQ2-9): Low Risk (08/23/2024)  Financial Resource Strain: Low Risk (08/19/2024)  Physical Activity: Sufficiently Active (08/19/2024)  Social Connections: Socially Integrated (08/19/2024)  Stress: No Stress Concern Present (08/19/2024)  Tobacco Use: Medium Risk (08/23/2024)  Health Literacy: Adequate Health Literacy (08/23/2024)   See flowsheets for full screening details  Depression Screen PHQ 2 & 9 Depression Scale- Over the past 2 weeks, how often have you been bothered by any of the following problems? Little interest or pleasure in doing things: 0 Feeling down, depressed, or hopeless (PHQ Adolescent also includes...irritable): 0 PHQ-2 Total Score: 0 Trouble falling or staying asleep, or sleeping too much: 0 Feeling tired or having little energy: 0 Poor appetite or overeating (PHQ Adolescent also includes...weight loss): 0 Feeling bad about yourself - or that you are a failure or have let yourself or your family down: 0 Trouble concentrating on things, such as reading the newspaper or watching television (PHQ Adolescent also includes...like school work): 0 Moving or speaking so slowly that other people could have noticed. Or the opposite - being so fidgety or restless that you have been moving around a lot more than usual: 0 Thoughts that you would be better off dead, or of hurting yourself in some way: 0 PHQ-9 Total Score: 0 If you checked off any problems, how difficult have these problems made it for you to do your work, take care of things at home, or get along with other people?: Not difficult at all  Depression Treatment Depression Interventions/Treatment : [id number] Score <4 Follow-up Not Indicated     Goals Addressed             This Visit's  Progress    Patient Stated   On track    Would like to exercise more and drink more water     Patient Stated   On track    I'd like to lose weight              Objective:    Today's Vitals   08/23/24 0810  Weight: 160 lb (72.6 kg)  Height: 5' 8 (1.727 m)   Body mass index is 24.33 kg/m.  Hearing/Vision screen Hearing Screening - Comments:: Patient denies any hearing difficulties.   Vision Screening - Comments:: Wears rx glasses - up to date with routine eye exams with  Maude Bring Immunizations and Health Maintenance Health Maintenance  Topic Date Due   Bone Density Scan  10/08/2023   Mammogram  10/10/2023   Influenza Vaccine  03/25/2024  COVID-19 Vaccine (11 - 2025-26 season) 04/25/2024   Medicare Annual Wellness (AWV)  05/10/2024   Colonoscopy  04/11/2029   DTaP/Tdap/Td (2 - Td or Tdap) 11/17/2032   Pneumococcal Vaccine: 50+ Years  Completed   Hepatitis C Screening  Completed   Zoster Vaccines- Shingrix  Completed   Meningococcal B Vaccine  Aged Out        Assessment/Plan:  This is a routine wellness examination for [name].  Patient Care Team: Tobie Suzzane POUR, MD as PCP - General (Internal Medicine) Leva Rush, MD as Consulting Physician (Obstetrics and Gynecology) Abigail Maude POUR [facility]) Dietrich Alyce BROCKS, MD as Referring Physician (Dermatology) Silva Juliene SAUNDERS, DPM as Consulting Physician (Podiatry)  I have personally reviewed and noted the following in the patients chart:   Medical and social history Use of alcohol, tobacco or illicit drugs  Current medications and supplements including opioid prescriptions. Functional ability and status Nutritional status Physical activity Advanced directives List of other physicians Hospitalizations, surgeries, and ER visits in previous 12 months Vitals Screenings to include cognitive, depression, and falls Referrals and appointments  No orders of the defined types were placed in this encounter.  In  addition, I have reviewed and discussed with patient certain preventive protocols, quality metrics, and best practice recommendations. A written personalized care plan for preventive services as well as general preventive health recommendations were provided to patient.   [name], CMA   08/23/2024   Return for Monday August 28, 2024 at 8:00 am for annual medicare wellness visit with the wellness nurse.  After Visit Summary: (MyChart) Due to this being a telephonic visit, the after visit summary with patients personalized plan was offered to patient via MyChart   "

## 2024-08-23 NOTE — Progress Notes (Signed)
 "  .HM Addressed: Last mammogram and bone denisty reports requested from Dr. Leva at [facility] Chief Complaint  Patient presents with   Medicare Wellness     Subjective:   [name] is a 68 y.o. female who presents for a Medicare Annual Wellness Visit.  Visit info / Clinical Intake: Medicare Wellness Visit Type:: Subsequent Annual Wellness Visit Persons participating in visit and providing information:: patient Medicare Wellness Visit Mode:: Video Since this visit was completed virtually, some vitals may be partially provided or unavailable. Missing vitals are due to the limitations of the virtual format.: Documented vitals are patient reported If Telephone or Video please confirm:: I connected with patient using audio/video enable telemedicine. I verified patient identity with two identifiers, discussed telehealth limitations, and patient agreed to proceed. Patient Location:: home Provider Location:: office Interpreter Needed?: No Pre-visit prep was completed: yes AWV questionnaire completed by patient prior to visit?: yes Date:: 08/19/24 Living arrangements:: (Patient-Rptd) lives with spouse/significant other Patient's Overall Health Status Rating: (Patient-Rptd) excellent Typical amount of pain: (Patient-Rptd) some Does pain affect daily life?: (Patient-Rptd) no Are you currently prescribed opioids?: no  Dietary Habits and Nutritional Risks How many meals a day?: (Patient-Rptd) 3 Eats fruit and vegetables daily?: (Patient-Rptd) yes Most meals are obtained by: (Patient-Rptd) preparing own meals In the last 2 weeks, have you had any of the following?: none Diabetic:: no  Functional Status Activities of Daily Living (to include ambulation/medication): (Patient-Rptd) Independent Ambulation: (Patient-Rptd) Independent Medication Administration: (Patient-Rptd) Independent Home Management (perform basic housework or laundry): (Patient-Rptd) Independent Manage  your own finances?: (Patient-Rptd) yes Primary transportation is: (Patient-Rptd) driving Concerns about vision?: no *vision screening is required for WTM* Concerns about hearing?: no  Fall Screening Falls in the past year?: (Patient-Rptd) 0 Number of falls in past year: 0 Was there an injury with Fall?: 0 Fall Risk Category Calculator: 0 Patient Fall Risk Level: Low Fall Risk  Fall Risk Patient at Risk for Falls Due to: No Fall Risks Fall risk Follow up: Falls evaluation completed; Education provided; Falls prevention discussed  Home and Transportation Safety: All rugs have non-skid backing?: (Patient-Rptd) yes All stairs or steps have railings?: (Patient-Rptd) yes Grab bars in the bathtub or shower?: (Patient-Rptd) yes Have non-skid surface in bathtub or shower?: (Patient-Rptd) yes Good home lighting?: (Patient-Rptd) yes Regular seat belt use?: (Patient-Rptd) yes Hospital stays in the last year:: (Patient-Rptd) no  Cognitive Assessment Difficulty concentrating, remembering, or making decisions? : (Patient-Rptd) no Will 6CIT or Mini Cog be Completed: yes What year is it?: 0 points What month is it?: 0 points Give patient an address phrase to remember (5 components): [address] [place] About what time is it?: 0 points Count backwards from 20 to 1: 0 points Say the months of the year in reverse: 0 points Repeat the address phrase from earlier: 0 points 6 CIT Score: 0 points  Advance Directives (For Healthcare) Does Patient Have a Medical Advance Directive?: No Would patient like information on creating a medical advance directive?: Yes (MAU/Ambulatory/Procedural Areas - Information given)  Reviewed/Updated  Reviewed/Updated: Reviewed All (Medical, Surgical, Family, Medications, Allergies, Care Teams, Patient Goals)    Allergies (verified) Patient has no known allergies.   Current Medications (verified) Outpatient Encounter Medications as of 08/23/2024   Medication Sig   estradiol (VIVELLE-DOT) 0.05 MG/24HR patch Place 1 patch onto the skin 2 (two) times a week.   Fiber POWD Take 1 Dose by mouth daily at 2 am. Prebiotic fiber supplement  loratadine (CLARITIN) 10 MG tablet Take 10 mg by mouth daily.   Magnesium Oxide (PHILLIPS PO)  (Patient taking differently: 1/2 at bedtime)   Multiple Vitamins-Minerals (CENTRUM SILVER  50+WOMEN) TABS Take 1 tablet by mouth daily at 6 (six) AM.   omeprazole  (PRILOSEC) 40 MG capsule Take 1 capsule (40 mg total) by mouth 2 (two) times daily.   Propylene Glycol-Glycerin ([employer] ARTIFICIAL TEARS) 1-0.3 % SOLN Apply 1 drop to eye daily. (Patient taking differently: Apply 1 drop to eye daily. 1-2 drops daily.)   rizatriptan  (MAXALT ) 10 MG tablet Take 1 tablet (10 mg total) by mouth as needed for migraine. May repeat in 2 hours if needed   rosuvastatin  (CRESTOR ) 10 MG tablet TAKE 1 TABLET BY MOUTH EVERY DAY   silver  sulfADIAZINE  (SILVADENE ) 1 % cream Apply 1 Application topically daily.   zolpidem (AMBIEN) 10 MG tablet Take 10 mg by mouth at bedtime as needed.   No facility-administered encounter medications on file as of 08/23/2024.    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   BCC (basal cell carcinoma of skin) 12/20/1993   bcc + margin left nasal bridge tx:exc.   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   scc in situ bowens left cheek   Seasonal allergies    Thyroid  nodule    per recent US , has enlarged, but being monitored now   Past Surgical History:  Procedure Laterality Date   ABDOMINAL HYSTERECTOMY N/A    [employer] 08/27/2020   BACK  SURGERY  12/24/2006   Lumbosacral spine   COLONOSCOPY     COSMETIC SURGERY  2002   Breast implants   ESOPHAGOGASTRODUODENOSCOPY N/A 12/09/2023   Procedure: EGD (ESOPHAGOGASTRODUODENOSCOPY);  Surgeon: Eartha Flavors, Toribio, MD;  Location: [facility];  Service: Gastroenterology;  Laterality: N/A;  11:30AM;ASA 1   NASAL SINUS SURGERY     neck injection N/A    rotator cuff surgery Right 11/25/2021   SPINE SURGERY N/A    [employer] 08/27/2020   thyroid  lumpectomy  08/26/1979   TONSILLECTOMY     UPPER GASTROINTESTINAL ENDOSCOPY     VAGINAL HYSTERECTOMY     Family History  Problem Relation Age of Onset   Diabetes Mother    COPD Mother    Hypertension Mother    Kidney disease Mother    Cancer Mother    Asthma Mother    Arthritis Mother    Hearing loss Mother    Miscarriages / Stillbirths Mother    Vision loss Mother    Varicose Veins Mother    Heart attack Father    Migraines Father    Early death Father    Migraines Sister    Stomach cancer Maternal Grandmother    Other Other        brain tumor   Colon cancer Neg Hx    Esophageal cancer Neg Hx    Rectal cancer Neg Hx    Colon polyps Neg Hx    Social History   Occupational History   Occupation: retired  Tobacco Use  Smoking status: Former    Current packs/day: 0.00    Average packs/day: 0.5 packs/day for 4.0 years (2.0 ttl pk-yrs)    Types: Cigarettes    Quit date: 08/25/1978    Years since quitting: 46.0   Smokeless tobacco: Never   Tobacco comments:    Quit when got pregnant and never smoked again.  Vaping Use   Vaping status: Never Used  Substance and Sexual Activity   Alcohol use: Yes    Alcohol/week: 3.0 standard drinks of alcohol    Types: 3 Glasses of wine per week   Drug use: Never   Sexual activity: Not Currently    Birth control/protection: Surgical   Tobacco Counseling Counseling given: Yes Tobacco comments: Quit when got pregnant and never smoked again.  SDOH Screenings   Food  Insecurity: No Food Insecurity (08/19/2024)  Housing: Low Risk (08/19/2024)  Transportation Needs: No Transportation Needs (08/19/2024)  Utilities: Not At Risk (08/23/2024)  Alcohol Screen: Low Risk (08/19/2024)  Depression (PHQ2-9): Low Risk (08/23/2024)  Financial Resource Strain: Low Risk (08/19/2024)  Physical Activity: Sufficiently Active (08/19/2024)  Social Connections: Socially Integrated (08/19/2024)  Stress: No Stress Concern Present (08/19/2024)  Tobacco Use: Medium Risk (08/23/2024)  Health Literacy: Adequate Health Literacy (08/23/2024)   See flowsheets for full screening details  Depression Screen PHQ 2 & 9 Depression Scale- Over the past 2 weeks, how often have you been bothered by any of the following problems? Little interest or pleasure in doing things: 0 Feeling down, depressed, or hopeless (PHQ Adolescent also includes...irritable): 0 PHQ-2 Total Score: 0 Trouble falling or staying asleep, or sleeping too much: 0 Feeling tired or having little energy: 0 Poor appetite or overeating (PHQ Adolescent also includes...weight loss): 0 Feeling bad about yourself - or that you are a failure or have let yourself or your family down: 0 Trouble concentrating on things, such as reading the newspaper or watching television (PHQ Adolescent also includes...like school work): 0 Moving or speaking so slowly that other people could have noticed. Or the opposite - being so fidgety or restless that you have been moving around a lot more than usual: 0 Thoughts that you would be better off dead, or of hurting yourself in some way: 0 PHQ-9 Total Score: 0 If you checked off any problems, how difficult have these problems made it for you to do your work, take care of things at home, or get along with other people?: Not difficult at all  Depression Treatment Depression Interventions/Treatment : EYV7-0 Score <4 Follow-up Not Indicated     Goals Addressed             This Visit's  Progress    Patient Stated   On track    Would like to exercise more and drink more water     Patient Stated   On track    I'd like to lose weight              Objective:    Today's Vitals   08/23/24 0810  Weight: 160 lb (72.6 kg)  Height: 5' 8 (1.727 m)   Body mass index is 24.33 kg/m.  Hearing/Vision screen Hearing Screening - Comments:: Patient denies any hearing difficulties.   Vision Screening - Comments:: Wears rx glasses - up to date with routine eye exams with  Maude Bring Immunizations and Health Maintenance Health Maintenance  Topic Date Due   Bone Density Scan  10/08/2023   Mammogram  10/10/2023   Influenza Vaccine  03/25/2024  COVID-19 Vaccine (11 - 2025-26 season) 04/25/2024   Medicare Annual Wellness (AWV)  08/23/2025   Colonoscopy  04/11/2029   DTaP/Tdap/Td (2 - Td or Tdap) 11/17/2032   Pneumococcal Vaccine: 50+ Years  Completed   Hepatitis C Screening  Completed   Zoster Vaccines- Shingrix  Completed   Meningococcal B Vaccine  Aged Out        Assessment/Plan:  This is a routine wellness examination for [name].  Patient Care Team: Tobie Suzzane POUR, MD as PCP - General (Internal Medicine) Leva Rush, MD as Consulting Physician (Obstetrics and Gynecology) Abigail Maude POUR [facility] Dietrich Alyce BROCKS, MD as Referring Physician (Dermatology) Silva Juliene SAUNDERS, DPM as Consulting Physician (Podiatry)  I have personally reviewed and noted the following in the patients chart:   Medical and social history Use of alcohol, tobacco or illicit drugs  Current medications and supplements including opioid prescriptions. Functional ability and status Nutritional status Physical activity Advanced directives List of other physicians Hospitalizations, surgeries, and ER visits in previous 12 months Vitals Screenings to include cognitive, depression, and falls Referrals and appointments  No orders of the defined types were placed in this encounter.  In  addition, I have reviewed and discussed with patient certain preventive protocols, quality metrics, and best practice recommendations. A written personalized care plan for preventive services as well as general preventive health recommendations were provided to patient.   Rosamae Rocque, CMA   08/23/2024   Return for Monday August 28, 2025 at 8:00 am for annual medicare wellness visit with the wellness nurse.  After Visit Summary: (MyChart) Due to this being a telephonic visit, the after visit summary with patients personalized plan was offered to patient via MyChart   "

## 2024-08-23 NOTE — Patient Instructions (Addendum)
 Ms. Hunton,  Thank you for taking the time for your Medicare Wellness Visit. I appreciate your continued commitment to your health goals. Please review the care plan we discussed, and feel free to reach out if I can assist you further.  Please note that Annual Wellness Visits do not include a physical exam. Some assessments may be limited, especially if the visit was conducted virtually. If needed, we may recommend an in-person follow-up with your provider.  Ongoing Care Seeing your primary care provider every 3 to 6 months helps us  monitor your health and provide consistent, personalized care.   1 year follow up for Medicare well visit: Monday August 28, 2025 at 8:00 am with medicare wellness nurse in office  You are due for your flu and covid vaccines. You may have these done at your preferred local pharmacy     Recommended Screenings:  Health Maintenance  Topic Date Due   Osteoporosis screening with Bone Density Scan  10/08/2023   Breast Cancer Screening  10/10/2023   Flu Shot  03/25/2024   COVID-19 Vaccine (11 - 2025-26 season) 04/25/2024   Medicare Annual Wellness Visit  05/10/2024   Colon Cancer Screening  04/11/2029   DTaP/Tdap/Td vaccine (2 - Td or Tdap) 11/17/2032   Pneumococcal Vaccine for age over 73  Completed   Hepatitis C Screening  Completed   Zoster (Shingles) Vaccine  Completed   Meningitis B Vaccine  Aged Out       08/19/2024    9:00 AM  Advanced Directives  Does Patient Have a Medical Advance Directive? No  Would patient like information on creating a medical advance directive? Yes (MAU/Ambulatory/Procedural Areas - Information given)    Vision: Annual vision screenings are recommended for early detection of glaucoma, cataracts, and diabetic retinopathy. These exams can also reveal signs of chronic conditions such as diabetes and high blood pressure.  Dental: Annual dental screenings help detect early signs of oral cancer, gum disease, and other  conditions linked to overall health, including heart disease and diabetes.  Please see the attached documents for additional preventive care recommendations.

## 2024-11-03 ENCOUNTER — Encounter: Admitting: Internal Medicine

## 2025-08-28 ENCOUNTER — Ambulatory Visit: Payer: Self-pay
